# Patient Record
Sex: Male | Born: 1970 | Race: Black or African American | Hispanic: No | Marital: Single | Smoking: Never smoker
Health system: Southern US, Community
[De-identification: ages and names within clinical notes are randomized; demographics above are authoritative.]

## PROBLEM LIST (undated history)

## (undated) DIAGNOSIS — F32A Depression, unspecified: Secondary | ICD-10-CM

## (undated) DIAGNOSIS — F419 Anxiety disorder, unspecified: Secondary | ICD-10-CM

## (undated) DIAGNOSIS — F329 Major depressive disorder, single episode, unspecified: Secondary | ICD-10-CM

## (undated) DIAGNOSIS — I5022 Chronic systolic (congestive) heart failure: Secondary | ICD-10-CM

---
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
## 2016-12-29 ENCOUNTER — Emergency Department (HOSPITAL_COMMUNITY): Payer: Self-pay

## 2016-12-29 ENCOUNTER — Encounter (HOSPITAL_COMMUNITY): Payer: Self-pay

## 2016-12-29 ENCOUNTER — Emergency Department (HOSPITAL_COMMUNITY)
Admission: EM | Admit: 2016-12-29 | Discharge: 2016-12-29 | Disposition: A | Discharge status: Home / Self Care | Payer: Self-pay | Attending: Emergency Medicine | Admitting: Emergency Medicine

## 2016-12-29 DIAGNOSIS — B349 Viral infection, unspecified: Secondary | ICD-10-CM | POA: Insufficient documentation

## 2016-12-29 LAB — CBC
HCT: 37.8 % — ABNORMAL LOW (ref 39.0–52.0)
HEMOGLOBIN: 12.6 g/dL — AB (ref 13.0–17.0)
MCH: 29.4 pg (ref 26.0–34.0)
MCHC: 33.3 g/dL (ref 30.0–36.0)
MCV: 88.3 fL (ref 78.0–100.0)
Platelets: 197 10*3/uL (ref 150–400)
RBC: 4.28 MIL/uL (ref 4.22–5.81)
RDW: 14.3 % (ref 11.5–15.5)
WBC: 5.4 10*3/uL (ref 4.0–10.5)

## 2016-12-29 LAB — BASIC METABOLIC PANEL
ANION GAP: 9 (ref 5–15)
BUN: 10 mg/dL (ref 6–20)
CO2: 26 mmol/L (ref 22–32)
Calcium: 9.2 mg/dL (ref 8.9–10.3)
Chloride: 101 mmol/L (ref 101–111)
Creatinine, Ser: 1.07 mg/dL (ref 0.61–1.24)
Glucose, Bld: 128 mg/dL — ABNORMAL HIGH (ref 65–99)
Potassium: 3.8 mmol/L (ref 3.5–5.1)
Sodium: 136 mmol/L (ref 135–145)

## 2016-12-29 LAB — I-STAT TROPONIN, ED: TROPONIN I, POC: 0 ng/mL (ref 0.00–0.08)

## 2016-12-29 MED ORDER — IBUPROFEN 600 MG PO TABS: 600.0000 mg | ORAL_TABLET | Freq: Four times a day (QID) | ORAL | 0 refills | Status: DC | PRN

## 2016-12-29 MED ORDER — ONDANSETRON 4 MG PO TBDP: 4.0000 mg | ORAL_TABLET | Freq: Three times a day (TID) | ORAL | 0 refills | Status: DC | PRN

## 2016-12-29 MED ORDER — PREDNISONE 10 MG PO TABS: 20.0000 mg | ORAL_TABLET | Freq: Two times a day (BID) | ORAL | 0 refills | Status: AC

## 2016-12-29 MED ORDER — BENZONATATE 100 MG PO CAPS: 100.0000 mg | ORAL_CAPSULE | Freq: Three times a day (TID) | ORAL | 0 refills | Status: DC

## 2016-12-29 NOTE — ED Provider Notes (Signed)
[facility]Y DEPT Provider Note   CSN: [id number] Arrival date & time: 12/29/16  0522     History   Chief Complaint Chief Complaint  Patient presents with  . Influenza  . Chest Pain    HPI Leonard Sweeney is a 46 y.o. male.  HPI   Leonard Sweeney is a 46 y.o. male, patient with no pertinent past medical history, presenting to the ED with Chest discomfort beginning yesterday. Patient states that for the last 5 days he has had nasal congestion, nausea, vomiting, subjective fever, and intermittent productive cough with yellow or green sputum. Patient states that he believes his cough has worsened. Yesterday he began to have chest discomfort that he describes as a soreness, only present upon coughing, rated 7 out of 10, located in the lower chest, nonradiating. Patient is taken Alka-Seltzer for his symptoms with mild relief. Patient denies shortness of breath, current nausea, diarrhea, or any other complaints.      History reviewed. No pertinent past medical history.  There are no active problems to display for this patient.   History reviewed. No pertinent surgical history.     Home Medications    Prior to Admission medications   Medication Sig Start Date End Date Taking? Authorizing Provider  benzonatate (TESSALON) 100 MG capsule Take 1 capsule (100 mg total) by mouth every 8 (eight) hours. 12/29/16   Shawn C Joy, PA-C  ibuprofen (ADVIL,MOTRIN) 600 MG tablet Take 1 tablet (600 mg total) by mouth every 6 (six) hours as needed. 12/29/16   Shawn C Joy, PA-C  ondansetron (ZOFRAN ODT) 4 MG disintegrating tablet Take 1 tablet (4 mg total) by mouth every 8 (eight) hours as needed for nausea or vomiting. 12/29/16   Shawn C Joy, PA-C  predniSONE (DELTASONE) 10 MG tablet Take 2 tablets (20 mg total) by mouth 2 (two) times daily with a meal. 12/29/16 01/03/17  Shawn C Joy, PA-C    Family History No family history on file.  Social History Social History  Substance Use Topics  . Smoking  status: Never Smoker  . Smokeless tobacco: Not on file  . Alcohol use Not on file     Allergies   Patient has no allergy information on record.   Review of Systems Review of Systems  Constitutional: Positive for fever (subjective).  HENT: Positive for congestion.   Respiratory: Positive for cough. Negative for shortness of breath.   Gastrointestinal: Positive for nausea and vomiting. Negative for abdominal pain and diarrhea.  Musculoskeletal: Negative for neck pain and neck stiffness.       Chest soreness  Skin: Negative for rash.  Neurological: Negative for dizziness, weakness, light-headedness, numbness and headaches.  All other systems reviewed and are negative.    Physical Exam Updated Vital Signs BP (!) 152/76 (BP Location: Right Arm)   Pulse 91   Temp 98.4 F (36.9 C) (Oral)   Resp 18   SpO2 96%   Physical Exam  Constitutional: He appears well-developed and well-nourished. No distress.  HENT:  Head: Normocephalic and atraumatic.  Mouth/Throat: Oropharynx is clear and moist.  Eyes: Conjunctivae are normal.  Neck: Normal range of motion. Neck supple.  Cardiovascular: Normal rate, regular rhythm, normal heart sounds and intact distal pulses.   Pulmonary/Chest: Effort normal and breath sounds normal. No respiratory distress.  Abdominal: Soft. There is no tenderness. There is no guarding.  Musculoskeletal: He exhibits no edema.  Lymphadenopathy:    He has no cervical adenopathy.  Neurological: He is alert.  Skin:  Skin is warm and dry. He is not diaphoretic.  Psychiatric: He has a normal mood and affect. His behavior is normal.  Nursing note and vitals reviewed.    ED Treatments / Results  Labs (all labs ordered are listed, but only abnormal results are displayed) Labs Reviewed  BASIC METABOLIC PANEL - Abnormal; Notable for the following:       Result Value   Glucose, Bld 128 (*)    All other components within normal limits  CBC - Abnormal; Notable for the  following:    Hemoglobin 12.6 (*)    HCT 37.8 (*)    All other components within normal limits  I-STAT TROPOININ, ED    EKG  EKG Interpretation  Date/Time:  Monday December 29 2016 05:51:08 EDT Ventricular Rate:  88 PR Interval:  162 QRS Duration: 94 QT Interval:  360 QTC Calculation: 435 R Axis:   12 Text Interpretation:  Normal sinus rhythm Minimal voltage criteria for LVH, may be normal variant ST & T wave abnormality, consider inferolateral ischemia Abnormal ECG Confirmed by Wilkie Aye  MD, COURTNEY ([id number]) on 12/29/2016 7:06:52 AM       Radiology Dg Chest 2 View  Result Date: 12/29/2016 CLINICAL DATA:  Midchest pain and dyspnea for 5 days. EXAM: CHEST  2 VIEW COMPARISON:  None. FINDINGS: The lungs are clear. The pulmonary vasculature is normal. Heart size is normal. Hilar and mediastinal contours are unremarkable. There is no pleural effusion. IMPRESSION: No active cardiopulmonary disease. Electronically Signed   By: Ellery Plunk M.D.   On: 12/29/2016 06:10    Procedures Procedures (including critical care time)  Medications Ordered in ED Medications - No data to display   Initial Impression / Assessment and Plan / ED Course  I have reviewed the triage vital signs and the nursing notes.  Pertinent labs & imaging results that were available during my care of the patient were reviewed by me and considered in my medical decision making (see chart for details).     Patient presents with symptoms consistent with viral syndrome. Patient is nontoxic appearing and has no signs of sepsis. Lab results are encouraging.  Low suspicion for ACS. HEART score is 1, indicating low risk for a cardiac event. Wells criteria score is 0, indicating low risk for PE. Patient is also PERC negative. EKG abnormality noted and is nonspecific. Patient has no active chest pain and is low risk. Patient advised to follow up with a PCP for repeat EKG and any further management of his current complaint. The  patient was given instructions for home care as well as return precautions. Patient voices understanding of these instructions, accepts the plan, and is comfortable with discharge.  Findings and plan of care discussed with Ross Marcus, MD.   Vitals:   12/29/16 0530 12/29/16 6045 12/29/16 0624 12/29/16 0700  BP: (!) 152/76   128/82  Pulse: 91 98 87 84  Resp: 18 18 (!) 26 14  Temp: 98.4 F (36.9 C)     TempSrc: Oral     SpO2: 96% 96% 97% 97%     Final Clinical Impressions(s) / ED Diagnoses   Final diagnoses:  Viral syndrome    New Prescriptions Discharge Medication List as of 12/29/2016  7:11 AM    START taking these medications   Details  benzonatate (TESSALON) 100 MG capsule Take 1 capsule (100 mg total) by mouth every 8 (eight) hours., Starting Mon 12/29/2016, Print    ibuprofen (ADVIL,MOTRIN) 600 MG tablet Take 1  tablet (600 mg total) by mouth every 6 (six) hours as needed., Starting Mon 12/29/2016, Print    ondansetron (ZOFRAN ODT) 4 MG disintegrating tablet Take 1 tablet (4 mg total) by mouth every 8 (eight) hours as needed for nausea or vomiting., Starting Mon 12/29/2016, Print    predniSONE (DELTASONE) 10 MG tablet Take 2 tablets (20 mg total) by mouth 2 (two) times daily with a meal., Starting Mon 12/29/2016, Until Sat 01/03/2017, Print         Anselm Pancoast, PA-C 12/29/16 1610    Shon Baton, MD 12/30/16 [phone]

## 2016-12-29 NOTE — Discharge Instructions (Signed)
There were no acute abnormalities on the chest x-ray. Your EKG showed some changes that may be chronic, but you should follow up with a primary care provider soon to have a repeat EKG performed.  Your symptoms are consistent with a viral illness. Viruses do not require antibiotics. Treatment is symptomatic care and it is important to note that these symptoms may last for 7-14 days.   Hand washing: Wash your hands throughout the day, but especially before and after touching the face, using the restroom, sneezing, coughing, or touching surfaces that have been coughed or sneezed upon. Hydration: Symptoms will be intensified and complicated by dehydration. Dehydration can also extend the duration of symptoms. Drink plenty of fluids and get plenty of rest. You should be drinking at least half a liter of water an hour to stay hydrated. Electrolyte drinks are also encouraged. You should be drinking enough fluids to make your urine light yellow, almost clear. If this is not the case, you are not drinking enough water. Please note that some of the treatments indicated below will not be effective if you are not adequately hydrated. Pain or fever: Ibuprofen, Naproxen, or Tylenol for pain or fever.  Nausea/vomiting: Use the Zofran for nausea or vomiting.  Cough: Use the Tessalon for cough.  Congestion: Plain Mucinex may help relieve congestion. Saline sinus rinses and saline nasal sprays may also help relieve congestion.  Sore throat: Warm liquids or Chloraseptic spray may help soothe a sore throat. Gargle twice a day with a salt water solution made from a half teaspoon of salt in a cup of warm water.  Follow up: Follow up with a primary care provider, as needed, for any future management of this issue.

## 2016-12-29 NOTE — ED Triage Notes (Signed)
Pt states he started having flu like sx 5 days ago and has acute onset of chest pain yesterday; pt presents with non-productive cough at triage; pt a&ox 4 on arrival; pt states right side chest pain at 8/10 on arrival.

## 2019-04-12 ENCOUNTER — Other Ambulatory Visit: Payer: Self-pay | Admitting: Family Medicine

## 2019-04-12 ENCOUNTER — Ambulatory Visit: Payer: Self-pay

## 2019-04-12 ENCOUNTER — Other Ambulatory Visit: Payer: Self-pay

## 2019-04-12 DIAGNOSIS — M79641 Pain in right hand: Secondary | ICD-10-CM

## 2019-04-12 DIAGNOSIS — M25531 Pain in right wrist: Secondary | ICD-10-CM

## 2019-04-12 DIAGNOSIS — M79631 Pain in right forearm: Secondary | ICD-10-CM

## 2019-04-12 MED ORDER — HYDROCODONE-ACETAMINOPHEN 5-325 MG PO TABS: ORAL_TABLET | ORAL | 0 refills | Status: DC

## 2019-04-27 ENCOUNTER — Ambulatory Visit: Payer: Self-pay | Admitting: Orthopaedic Surgery

## 2019-04-28 ENCOUNTER — Ambulatory Visit: Payer: Self-pay | Admitting: Orthopaedic Surgery

## 2019-05-10 ENCOUNTER — Ambulatory Visit (INDEPENDENT_AMBULATORY_CARE_PROVIDER_SITE_OTHER): Payer: Worker's Compensation | Admitting: Orthopaedic Surgery

## 2019-05-10 ENCOUNTER — Ambulatory Visit: Payer: Self-pay

## 2019-05-10 ENCOUNTER — Encounter: Payer: Self-pay | Admitting: Orthopaedic Surgery

## 2019-05-10 DIAGNOSIS — M25531 Pain in right wrist: Secondary | ICD-10-CM | POA: Diagnosis not present

## 2019-05-10 DIAGNOSIS — S52531A Colles' fracture of right radius, initial encounter for closed fracture: Secondary | ICD-10-CM | POA: Diagnosis not present

## 2019-05-10 MED ORDER — HYDROCODONE-ACETAMINOPHEN 5-325 MG PO TABS: ORAL_TABLET | ORAL | 0 refills | Status: DC

## 2019-05-10 NOTE — Progress Notes (Signed)
Office Visit Note   Patient: Leonard Sweeney           Date of Birth: 10/27/1970           MRN: [id number] Visit Date: 05/10/2019              Requested by: No referring provider defined for this encounter. PCP: Patient, No Pcp Per   Assessment & Plan: Visit Diagnoses:  1. Pain in right wrist   2. Closed Colles' fracture of right radius, initial encounter     Plan: This is a fracture that we can treat nonoperatively.  He will continue the Velcro wrist splint but will come out of it to work on range of motion of his wrist.  I will send in some hydrocodone for his pain and have recommended over-the-counter Advil which will be 600 800 mg 2-3 times daily for inflammation and pain.  We will need to keep him out of his work for likely the next 6 to 8 weeks as he recovers from this injury.  I would like to see him back in 4 weeks with a repeat 2 views of the right wrist.  All question concerns were answered and addressed.  Follow-Up Instructions: Return in about 4 weeks (around 06/07/2019).   Orders:  Orders Placed This Encounter  Procedures  . XR Wrist 2 Views Right   Meds ordered this encounter  Medications  . HYDROcodone-acetaminophen (NORCO/VICODIN) 5-325 MG tablet    Sig: 1 pill every 6 hours as needed for breakthrough pain.    Dispense:  40 tablet    Refill:  0      Procedures: No procedures performed   Clinical Data: No additional findings.   Subjective: Chief Complaint  Patient presents with  . Right Hand - Pain  The patient is a right-hand-dominant 48 year old gentleman who sustained a work-related injury to his right wrist and forearm on July 13 of this year.  He was seen in the emergency room and found to have an extra-articular nondisplaced distal radius fracture and was placed in a Velcro wrist splint.  I believe Worker's Compensation has scheduled him with [place] today for follow-up.  He has not seen anyone from an orthopedic surgery standpoint for his right wrist in  the last 3 weeks since he sustained this injury.  He does report throbbing right wrist pain that does wake him up at night.  He does report some numbness and tingling in his right hand.  He has been wearing the Velcro wrist splint.  He is not a smoker and is not a diabetic.  He denies any other injuries as it relates to his right wrist injury.  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  HPI  Review of Systems He currently denies any headache, chest pain, shortness of breath, fever, chills, nausea, vomiting  Objective: Vital Signs: There were no vitals taken for this visit.  Physical Exam He is alert and orient x3 and in no acute distress Ortho Exam Examination of his right wrist does show some slight swelling.  He has limitations with palmar flexion and dorsiflexion related to pain.  He has full pronation supination but it is painful.  He hurts to palpation along the distal radius.  The wrist is well located.  His fingers are stiff but well-perfused.  He can make a composite fist but is slow to do so.  He has some slight subjective numbness in the median nerve  distribution but it is only slight.  He has palpable radial and ulnar pulses. Specialty Comments:  No specialty comments available.  Imaging: Xr Wrist 2 Views Right  Result Date: 05/10/2019 2 views of the right wrist show an extra-articular distal radius fracture and a ulnar styloid fracture.  The distal radius fracture is not shortened and is in neutral alignment on the lateral view.  There has been interval healing when compared to x-rays from 3 weeks ago.    PMFS History: There are no active problems to display for this patient.  History reviewed. No pertinent past medical history.  History reviewed. No pertinent family history.  History reviewed. No pertinent surgical history. Social History   Occupational History  . Not on file  Tobacco Use  . Smoking status: Never Smoker  Substance  and Sexual Activity  . Alcohol use: Not on file  . Drug use: Not on file  . Sexual activity: Not on file

## 2019-05-18 ENCOUNTER — Telehealth: Payer: Self-pay

## 2019-05-18 NOTE — Telephone Encounter (Signed)
Faxed the 05/10/19 office and work note to [facility] per her request  Pt is to return in 4 weeks but no appt was made. Asked her if I can make the appt with her or call pt?

## 2019-05-24 NOTE — Telephone Encounter (Signed)
Tried to call this patient for you. The telephone # is no good and contact person is no longer at the facility of where the number belongs to.

## 2019-06-02 ENCOUNTER — Telehealth: Payer: Self-pay | Admitting: Orthopaedic Surgery

## 2019-06-02 MED ORDER — HYDROCODONE-ACETAMINOPHEN 5-325 MG PO TABS: 1.0000 | ORAL_TABLET | Freq: Four times a day (QID) | ORAL | 0 refills | Status: DC | PRN

## 2019-06-02 NOTE — Telephone Encounter (Signed)
Please advise 

## 2019-06-02 NOTE — Telephone Encounter (Signed)
I will send in some.

## 2019-06-02 NOTE — Telephone Encounter (Signed)
Pt called in requesting a refill on medication hydrocodone, please have that sent to [facility] on [address]   [phone]

## 2019-06-02 NOTE — Telephone Encounter (Signed)
When u call pt give room number [phone]

## 2019-06-13 ENCOUNTER — Ambulatory Visit (INDEPENDENT_AMBULATORY_CARE_PROVIDER_SITE_OTHER): Payer: Worker's Compensation

## 2019-06-13 ENCOUNTER — Encounter: Payer: Self-pay | Admitting: Orthopaedic Surgery

## 2019-06-13 ENCOUNTER — Ambulatory Visit (INDEPENDENT_AMBULATORY_CARE_PROVIDER_SITE_OTHER): Payer: Worker's Compensation | Admitting: Orthopaedic Surgery

## 2019-06-13 DIAGNOSIS — S52531D Colles' fracture of right radius, subsequent encounter for closed fracture with routine healing: Secondary | ICD-10-CM

## 2019-06-13 DIAGNOSIS — S52531A Colles' fracture of right radius, initial encounter for closed fracture: Secondary | ICD-10-CM | POA: Insufficient documentation

## 2019-06-13 MED ORDER — HYDROCODONE-ACETAMINOPHEN 5-325 MG PO TABS: 1.0000 | ORAL_TABLET | Freq: Four times a day (QID) | ORAL | 0 refills | Status: DC | PRN

## 2019-06-13 NOTE — Progress Notes (Signed)
The patient is now 8 weeks status post a nondisplaced extra-articular right distal radius fracture.  He also had an ulnar styloid fracture.  He is been wearing a Velcro wrist splint.  He does report some wrist pain and stiffness but does feel like he is making progress.  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  He is right-hand dominant and this is his dominant side.  On exam there is no swelling or bruising about the right wrist.  It is ligamentously stable on exam.  There is stiffness with palmar flexion and dorsiflexion but the range of motion is almost full.  There is no pain to palpation over the ulnar styloid.  2 views of his right wrist are obtained today and it does show that the distal radius fracture itself looks like it is almost healed completely.  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  He will continue to work for just 4 more weeks given the fact that he performs heavy manual labor.  We will see him back for final visit in 4 weeks with a repeat 2 views of his right wrist.  All question concerns were answered and addressed.

## 2019-06-20 ENCOUNTER — Telehealth: Payer: Self-pay | Admitting: Orthopaedic Surgery

## 2019-06-20 NOTE — Telephone Encounter (Signed)
FYI

## 2019-06-20 NOTE — Telephone Encounter (Signed)
Emma from the [employer] group called in reference to a letter from the [employer] and would like for Dr. Ninfa Linden to hold off on his response until the [employer] group prepares their response.  Emma from the law office will call when it is ready.  CB#[phone].  Thank you.

## 2019-06-23 ENCOUNTER — Other Ambulatory Visit: Payer: Self-pay | Admitting: Orthopaedic Surgery

## 2019-06-23 ENCOUNTER — Telehealth: Payer: Self-pay | Admitting: Orthopaedic Surgery

## 2019-06-23 MED ORDER — HYDROCODONE-ACETAMINOPHEN 5-325 MG PO TABS: 1.0000 | ORAL_TABLET | Freq: Three times a day (TID) | ORAL | 0 refills | Status: DC | PRN

## 2019-06-23 NOTE — Telephone Encounter (Signed)
Please advise 

## 2019-06-23 NOTE — Telephone Encounter (Signed)
Patient called. Would like a refill on his Hydrocodone sent in. His call back number is [phone].

## 2019-07-04 ENCOUNTER — Telehealth: Payer: Self-pay | Admitting: Orthopaedic Surgery

## 2019-07-04 MED ORDER — HYDROCODONE-ACETAMINOPHEN 5-325 MG PO TABS: 1.0000 | ORAL_TABLET | Freq: Three times a day (TID) | ORAL | 0 refills | Status: DC | PRN

## 2019-07-04 NOTE — Telephone Encounter (Signed)
Patient called needing Rx refilled Hydrocodone. The humber to contact patient is [phone] Room [phone]

## 2019-07-04 NOTE — Telephone Encounter (Signed)
I have called patient multiple times, I can't get ahold of him

## 2019-07-04 NOTE — Telephone Encounter (Signed)
I will send in hydrocodone one more time for him.  Please let him know that this would be the last one we call and because he will soon be over 3 months out from his original injury and we need him to be off of these medications at this point.

## 2019-07-04 NOTE — Telephone Encounter (Signed)
Please advise 

## 2019-07-13 ENCOUNTER — Other Ambulatory Visit: Payer: Self-pay

## 2019-07-13 ENCOUNTER — Ambulatory Visit (INDEPENDENT_AMBULATORY_CARE_PROVIDER_SITE_OTHER): Payer: Worker's Compensation | Admitting: Orthopaedic Surgery

## 2019-07-13 ENCOUNTER — Encounter: Payer: Self-pay | Admitting: Orthopaedic Surgery

## 2019-07-13 ENCOUNTER — Ambulatory Visit (INDEPENDENT_AMBULATORY_CARE_PROVIDER_SITE_OTHER): Payer: Worker's Compensation

## 2019-07-13 DIAGNOSIS — S52531D Colles' fracture of right radius, subsequent encounter for closed fracture with routine healing: Secondary | ICD-10-CM | POA: Diagnosis not present

## 2019-07-13 MED ORDER — NABUMETONE 750 MG PO TABS: 750.0000 mg | ORAL_TABLET | Freq: Two times a day (BID) | ORAL | 1 refills | Status: DC | PRN

## 2019-07-13 NOTE — Progress Notes (Signed)
The patient is now 3 months into a nondisplaced right extra-articular distal radius fracture and ulnar styloid fracture that occurred on July 13 of this year.  This was during a work-related accident.  He has been out of work since then.  We have been just a Velcro wrist splint.  He still reports significant right wrist pain.  We have had him on hydrocodone for the last 3 months and I counseled him about this letting him know that we cannot keep him on that medication.  On exam his wrist function is almost completely normal.  He has a lack of full supination by just a few degrees and full dorsiflexion by only just a few degrees.  There is no swelling.  The wrist is stable ligamentously.  There is no pain over the ulnar styloid at all.  He reports it only hurts over the radial aspect of the wrist.  2 views of the distal radius are obtained today and compared to previous films.  The distal radius fracture is completely healed.  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  At this point of encouraged him again to not taking more narcotics and will send in some Relafen as an anti-inflammatory.  I want him to be out of the splint now.  I did give him a prescription for therapy on his wrist who with certified hand therapist to work on dexterity, coordination, range of motion and strengthening.  We will give him a work note to keep him out of work for 4 more weeks.  When we see him back in 4 weeks no x-rays are needed and hopefully we can work on getting him back to work.  All question concerns were answered and addressed.

## 2019-08-08 ENCOUNTER — Ambulatory Visit (INDEPENDENT_AMBULATORY_CARE_PROVIDER_SITE_OTHER): Payer: Worker's Compensation | Admitting: Orthopaedic Surgery

## 2019-08-08 ENCOUNTER — Other Ambulatory Visit: Payer: Self-pay

## 2019-08-08 ENCOUNTER — Encounter: Payer: Self-pay | Admitting: Orthopaedic Surgery

## 2019-08-08 DIAGNOSIS — M25531 Pain in right wrist: Secondary | ICD-10-CM

## 2019-08-08 DIAGNOSIS — S52531D Colles' fracture of right radius, subsequent encounter for closed fracture with routine healing: Secondary | ICD-10-CM

## 2019-08-08 MED ORDER — HYDROCODONE-ACETAMINOPHEN 5-325 MG PO TABS: 1.0000 | ORAL_TABLET | Freq: Three times a day (TID) | ORAL | 0 refills | Status: DC | PRN

## 2019-08-08 NOTE — Progress Notes (Signed)
The patient is now almost 4 months status post sustaining an extra-articular nondisplaced right distal radius fracture of his dominant wrist.  He has been out of work since then as he recovers from this injury.  His work involves heavy Retail buyer.  X-rays from his previous visit showed the fracture to heal completely so we did not need an x-ray today.  He says he is probably doing much better overall with his range of motion and strength.  He states that [employer] would not approve physical therapy so he did a home exercise program which he still participates in.  On examination of his right wrist, his rotation is full.  His flexion extension is full.  He has excellent grip strength.  His right hand is well-perfused.  There is no swelling.  It is ligamentously stable and seems to be pain-free.  His range of motion on the right wrist is exactly equal to the range of motion left wrist.  At this point follow-up can be as needed since he is doing so well.  There is no disability rating as it relates to his right wrist injury since his range of motion is full and his x-rays previously showed a healed fracture that was extra-articular.  All question concerns were answered and addressed.  I will send in pain medication 1 more time to use rarely and sparingly.  Follow-up is as needed at this point.

## 2019-09-01 ENCOUNTER — Telehealth: Payer: Self-pay | Admitting: Orthopaedic Surgery

## 2019-09-01 NOTE — Telephone Encounter (Signed)
At this point he is now 5 months out from his injury and when I saw him in early November the fracture had healed completely.  I told him at that point that would be the last time we provide hydrocodone for him.  He just needs to try Advil or Aleve as well as he can get a tube of Voltaren gel as a topical anti-inflammatory from the drugstore to rub on his wrist 2-3 times daily.

## 2019-09-01 NOTE — Telephone Encounter (Signed)
Tried to call patient, no answer.

## 2019-09-01 NOTE — Telephone Encounter (Signed)
Please advise 

## 2019-09-01 NOTE — Telephone Encounter (Signed)
Patient called requesting an RX refill on his Hydrocodone.  Patient uses [facility] on [address].  CB#[phone].  Thank you.

## 2019-09-30 ENCOUNTER — Encounter (HOSPITAL_COMMUNITY): Payer: Self-pay | Admitting: Family

## 2019-09-30 ENCOUNTER — Encounter (HOSPITAL_COMMUNITY): Payer: Self-pay | Admitting: Emergency Medicine

## 2019-09-30 ENCOUNTER — Other Ambulatory Visit: Payer: Self-pay

## 2019-09-30 ENCOUNTER — Emergency Department (HOSPITAL_COMMUNITY)
Admission: EM | Admit: 2019-09-30 | Discharge: 2019-09-30 | Disposition: A | Discharge status: Other Acute Inpatient Hospital | Payer: Self-pay | Attending: Emergency Medicine | Admitting: Emergency Medicine

## 2019-09-30 ENCOUNTER — Observation Stay (HOSPITAL_COMMUNITY)
Admission: AD | Admit: 2019-09-30 | Discharge: 2019-10-01 | Disposition: A | Discharge status: Home / Self Care | Payer: Self-pay | Source: Intra-hospital | Attending: Psychiatry | Admitting: Psychiatry

## 2019-09-30 DIAGNOSIS — R45851 Suicidal ideations: Secondary | ICD-10-CM | POA: Insufficient documentation

## 2019-09-30 DIAGNOSIS — G4700 Insomnia, unspecified: Secondary | ICD-10-CM | POA: Insufficient documentation

## 2019-09-30 DIAGNOSIS — F1494 Cocaine use, unspecified with cocaine-induced mood disorder: Principal | ICD-10-CM | POA: Diagnosis present

## 2019-09-30 DIAGNOSIS — Z87891 Personal history of nicotine dependence: Secondary | ICD-10-CM | POA: Insufficient documentation

## 2019-09-30 DIAGNOSIS — Z590 Homelessness: Secondary | ICD-10-CM | POA: Insufficient documentation

## 2019-09-30 DIAGNOSIS — F329 Major depressive disorder, single episode, unspecified: Secondary | ICD-10-CM | POA: Insufficient documentation

## 2019-09-30 DIAGNOSIS — Z79899 Other long term (current) drug therapy: Secondary | ICD-10-CM | POA: Insufficient documentation

## 2019-09-30 DIAGNOSIS — Z20822 Contact with and (suspected) exposure to covid-19: Secondary | ICD-10-CM | POA: Insufficient documentation

## 2019-09-30 DIAGNOSIS — F419 Anxiety disorder, unspecified: Secondary | ICD-10-CM | POA: Insufficient documentation

## 2019-09-30 DIAGNOSIS — Z791 Long term (current) use of non-steroidal anti-inflammatories (NSAID): Secondary | ICD-10-CM | POA: Insufficient documentation

## 2019-09-30 DIAGNOSIS — F1020 Alcohol dependence, uncomplicated: Secondary | ICD-10-CM | POA: Insufficient documentation

## 2019-09-30 HISTORY — DX: Anxiety disorder, unspecified: F41.9

## 2019-09-30 HISTORY — DX: Depression, unspecified: F32.A

## 2019-09-30 LAB — CBC WITH DIFFERENTIAL/PLATELET
Abs Immature Granulocytes: 0.02 10*3/uL (ref 0.00–0.07)
Basophils Absolute: 0 10*3/uL (ref 0.0–0.1)
Basophils Relative: 0 %
Eosinophils Absolute: 0 10*3/uL (ref 0.0–0.5)
Eosinophils Relative: 1 %
HCT: 41.8 % (ref 39.0–52.0)
Hemoglobin: 13.6 g/dL (ref 13.0–17.0)
Immature Granulocytes: 0 %
Lymphocytes Relative: 23 %
Lymphs Abs: 1.6 10*3/uL (ref 0.7–4.0)
MCH: 30.8 pg (ref 26.0–34.0)
MCHC: 32.5 g/dL (ref 30.0–36.0)
MCV: 94.8 fL (ref 80.0–100.0)
Monocytes Absolute: 0.6 10*3/uL (ref 0.1–1.0)
Monocytes Relative: 9 %
Neutro Abs: 4.7 10*3/uL (ref 1.7–7.7)
Neutrophils Relative %: 67 %
Platelets: 266 10*3/uL (ref 150–400)
RBC: 4.41 MIL/uL (ref 4.22–5.81)
RDW: 12.7 % (ref 11.5–15.5)
WBC: 7 10*3/uL (ref 4.0–10.5)
nRBC: 0 % (ref 0.0–0.2)

## 2019-09-30 LAB — RESPIRATORY PANEL BY RT PCR (FLU A&B, COVID)
Influenza A by PCR: NEGATIVE
Influenza B by PCR: NEGATIVE
SARS Coronavirus 2 by RT PCR: NEGATIVE

## 2019-09-30 LAB — COMPREHENSIVE METABOLIC PANEL
ALT: 28 U/L (ref 0–44)
AST: 35 U/L (ref 15–41)
Albumin: 3.9 g/dL (ref 3.5–5.0)
Alkaline Phosphatase: 69 U/L (ref 38–126)
Anion gap: 9 (ref 5–15)
BUN: 11 mg/dL (ref 6–20)
CO2: 29 mmol/L (ref 22–32)
Calcium: 9.8 mg/dL (ref 8.9–10.3)
Chloride: 102 mmol/L (ref 98–111)
Creatinine, Ser: 1.16 mg/dL (ref 0.61–1.24)
GFR calc Af Amer: >60 mL/min (ref >60)
GFR calc non Af Amer: >60 mL/min (ref >60)
Glucose, Bld: 103 mg/dL — ABNORMAL HIGH (ref 70–99)
Potassium: 3.7 mmol/L (ref 3.5–5.1)
Sodium: 140 mmol/L (ref 135–145)
Total Bilirubin: 0.8 mg/dL (ref 0.3–1.2)
Total Protein: 8 g/dL (ref 6.5–8.1)

## 2019-09-30 LAB — ACETAMINOPHEN LEVEL: Acetaminophen (Tylenol), Serum: <10 ug/mL — ABNORMAL LOW (ref 10–30)

## 2019-09-30 LAB — RAPID URINE DRUG SCREEN, HOSP PERFORMED
Amphetamines: NOT DETECTED
Barbiturates: NOT DETECTED
Benzodiazepines: NOT DETECTED
Cocaine: POSITIVE — AB
Opiates: NOT DETECTED
Tetrahydrocannabinol: NOT DETECTED

## 2019-09-30 LAB — ETHANOL: Alcohol, Ethyl (B): <10 mg/dL (ref <10)

## 2019-09-30 LAB — SALICYLATE LEVEL: Salicylate Lvl: <7 mg/dL — ABNORMAL LOW (ref 7.0–30.0)

## 2019-09-30 MED ORDER — ALUM & MAG HYDROXIDE-SIMETH 200-200-20 MG/5ML PO SUSP: 30.0000 mL | ORAL | Status: DC | PRN

## 2019-09-30 MED ORDER — MAGNESIUM HYDROXIDE 400 MG/5ML PO SUSP: 30.0000 mL | Freq: Every day | ORAL | Status: DC | PRN

## 2019-09-30 MED ORDER — TRAZODONE HCL 50 MG PO TABS: 50.0000 mg | ORAL_TABLET | Freq: Every evening | ORAL | Status: DC | PRN

## 2019-09-30 MED ORDER — HYDROXYZINE HCL 25 MG PO TABS: 25.0000 mg | ORAL_TABLET | Freq: Three times a day (TID) | ORAL | Status: DC | PRN

## 2019-09-30 MED ORDER — ACETAMINOPHEN 325 MG PO TABS: 650.0000 mg | ORAL_TABLET | Freq: Four times a day (QID) | ORAL | Status: DC | PRN

## 2019-09-30 NOTE — BH Assessment (Signed)
Tele Assessment Note   Patient Name: Leonard Sweeney MRN: [id number] Referring Physician: Arthor Captain Location of Patient: [facility] Location of Provider: Behavioral Health TTS Department  Jemmie Rhinehart is an 49 y.o. male who presented to [facility] seeking help for his cocaine problem, depression and his suicidal ideation.  Patient states that he has been using cocaine since the age of [age].  He states that his use has caused him to lose everything and he feels like he is just in people's way and states that they would be better off if he was dead.  Patient states that he has one prior suicide attempt at age [age] by cutting his wrist. Patient states that he is currently having thoughts of cutting himself again or to walk out in front of traffic.  Patient denies any current HI, but states that he hears voices that "give me bad advice."  Patient states that he also gets very paranoid at times.  Patient states that he is currently using 2-3 grams of cocaine daily and states that he has been drinking a quart of beer 3-4 times weekly.  Patient states that he is having sleep disturbance and states that he is only sleeping 2 hours per night and states that he has not been eating and states that he has lost approximately 60 pounds.  Patient denies any history of abuse.   Patient states that he was recently attending recovery meetings and states that he was living in a transitional house, but states that since he has relapsed that he has most likely cannot return there.  Patient states that he is single and has one grown children.  Patient presented as alert and oriented.  His mood is depressed and his affect flat.  His judgment, insight and impulse control are were impaired.  He did not appear to be responding to any internal stimuli.  His thoughts were organized and his memory intact.  His eye contact was good and his speech coherent.  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 No pertinent past medical history.  History reviewed. No pertinent surgical history.  Family History: No family history on file.  Social History:  reports that he has never smoked. He has never used smokeless tobacco. He reports current alcohol use. He reports current drug use. Drug: Cocaine.  Additional Social History:  Alcohol / Drug Use Pain Medications: see MAR Prescriptions: see MAR Over the Counter: see MAR History of alcohol / drug use?: Yes Longest period of sobriety (when/how long): UTA Negative Consequences of Use: Financial, Personal relationships, Work / School Substance #1 Name of Substance 1: cocaine 1 - Age of First Use: 21 1 - Amount (size/oz): 2-3 grams daily 1 - Frequency: daily 1 - Duration: since age 21 1 - Last Use / Amount: last pm Substance #2 Name of Substance 2: alcohol 2 - Age of First Use: 13 2 - Amount (size/oz): 1 quart beer 2 - Frequency: 2-3 times a week 2 - Duration: since onset 2 - Last Use / Amount: last pm  CIWA: CIWA-Ar BP: (!) 151/84 Pulse Rate: 85 COWS:    Allergies: Not on File  Home Medications: (Not in a hospital admission)   OB/GYN Status:  No LMP for male patient.  General Assessment Data Assessment unable to be completed: Yes Reason for not completing assessment: multiple walk[facility]ins at [facility] Location of Assessment: [facility] TTS Assessment: In system Is this a Tele or Face-to-Face Assessment?:  Tele Assessment Is this an Initial Assessment or a Re-assessment for this encounter?: Initial Assessment Patient Accompanied by:: N/A Language Other than English: No Living Arrangements: Homeless/Shelter What gender do you identify as?: Male Marital status: Single Living Arrangements: Alone Can pt return to current living arrangement?: Yes Admission Status: Voluntary Is patient capable of signing voluntary admission?: Yes Referral Source: Self/Family/Friend Insurance type:  self-pay     Crisis Care Plan Living Arrangements: Alone Legal Guardian: Other:(self) Name of Psychiatrist: none Name of Therapist: none  Education Status Is patient currently in school?: No Is the patient employed, unemployed or receiving disability?: Unemployed  Risk to self with the past 6 months Suicidal Ideation: Yes-Currently Present Has patient been a risk to self within the past 6 months prior to admission? : No Suicidal Intent: Yes-Currently Present Has patient had any suicidal intent within the past 6 months prior to admission? : No Is patient at risk for suicide?: Yes Suicidal Plan?: Yes-Currently Present Has patient had any suicidal plan within the past 6 months prior to admission? : No Specify Current Suicidal Plan: cut self or walk into traffic Access to Means: Yes Specify Access to Suicidal Means: streets and available sharpes What has been your use of drugs/alcohol within the last 12 months?: daily cocaine use and ETOH 3-4 times weekly Previous Attempts/Gestures: Yes How many times?: 1 Other Self Harm Risks: homeless and minimal support Triggers for Past Attempts: Unknown Intentional Self Injurious Behavior: Cutting Comment - Self Injurious Behavior: just once Family Suicide History: No Recent stressful life event(s): Job Loss, Financial Problems, Other (Comment)(homeless) Persecutory voices/beliefs?: No Depression: Yes Depression Symptoms: Despondent, Insomnia, Isolating, Loss of interest in usual pleasures, Feeling worthless/self pity Substance abuse history and/or treatment for substance abuse?: Yes Suicide prevention information given to non-admitted patients: Not applicable  Risk to Others within the past 6 months Homicidal Ideation: No Does patient have any lifetime risk of violence toward others beyond the six months prior to admission? : No Thoughts of Harm to Others: No Current Homicidal Intent: No Current Homicidal Plan: No Access to Homicidal  Means: No Identified Victim: none History of harm to others?: No Assessment of Violence: None Noted Violent Behavior Description: none Does patient have access to weapons?: No Criminal Charges Pending?: No Does patient have a court date: No Is patient on probation?: No  Psychosis Hallucinations: Auditory Delusions: None noted  Mental Status Report Appearance/Hygiene: Unremarkable Eye Contact: Good Motor Activity: Freedom of movement Speech: Unremarkable Level of Consciousness: Alert Mood: Depressed, Apathetic Affect: Depressed, Flat Anxiety Level: Moderate Thought Processes: Coherent, Relevant Judgement: Impaired Orientation: Person, Place, Time, Situation Obsessive Compulsive Thoughts/Behaviors: None  Cognitive Functioning Concentration: Normal Memory: Recent Intact, Remote Intact Is patient IDD: No Insight: Fair Impulse Control: Poor Appetite: Poor Have you had any weight changes? : Loss Amount of the weight change? (lbs): 60 lbs Sleep: Decreased Total Hours of Sleep: 2 Vegetative Symptoms: Decreased grooming  ADLScreening [facility] Assessment Services) Patient's cognitive ability adequate to safely complete daily activities?: Yes Patient able to express need for assistance with ADLs?: Yes Independently performs ADLs?: Yes (appropriate for developmental age)  Prior Inpatient Therapy Prior Inpatient Therapy: Yes Prior Therapy Dates: years ago Prior Therapy Facilty/Provider(s): [facility] Reason for Treatment: cocaine  Prior Outpatient Therapy Prior Outpatient Therapy: No Does patient have an ACCT team?: No Does patient have Intensive In-House Services?  : No Does patient have [facility] services? : No Does patient have P4CC services?: No  ADL Screening (condition at time of admission) Patient's  cognitive ability adequate to safely complete daily activities?: Yes Is the patient deaf or have difficulty hearing?: No Does the patient have difficulty seeing, even  when wearing glasses/contacts?: No Does the patient have difficulty concentrating, remembering, or making decisions?: No Patient able to express need for assistance with ADLs?: Yes Does the patient have difficulty dressing or bathing?: No Independently performs ADLs?: Yes (appropriate for developmental age) Does the patient have difficulty walking or climbing stairs?: No Weakness of Legs: None Weakness of Arms/Hands: None  Home Assistive Devices/Equipment Home Assistive Devices/Equipment: None  Therapy Consults (therapy consults require a physician order) PT Evaluation Needed: No OT Evalulation Needed: No SLP Evaluation Needed: No Abuse/Neglect Assessment (Assessment to be complete while patient is alone) Abuse/Neglect Assessment Can Be Completed: Yes Physical Abuse: Denies Verbal Abuse: Denies Sexual Abuse: Denies Exploitation of patient/patient's resources: Denies Self-Neglect: Denies Values / Beliefs Cultural Requests During Hospitalization: None Spiritual Requests During Hospitalization: None Consults Spiritual Care Consult Needed: No Transition of Care Team Consult Needed: No Advance Directives (For Healthcare) Does Patient Have a Medical Advance Directive?: No Would patient like information on creating a medical advance directive?: No - Patient declined Nutrition Screen- [facility] Has the patient recently lost weight without trying?: Yes, 34 lbs. or greater Has the patient been eating poorly because of a decreased appetite?: Yes Malnutrition Screening Tool Score: 5        Disposition: Per Berneice Heinrich, patient will need to be observed and monitored for safety overnight and may be moved to an OBS bed if one is available.  Patient will be re-assessed by a provider tomorrow morning.  Disposition Initial Assessment Completed for this Encounter: Yes  This service was provided via telemedicine using a 2-way, interactive audio and video technology.  Names of all  persons participating in this telemedicine service and their role in this encounter. Name: Elston Aldape Role: patient  Name: Avenly Roberge Role: TTS  Name:  Role:   Name:  Role:     Daphene Calamity 09/30/2019 6:40 PM

## 2019-09-30 NOTE — H&P (Signed)
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 male who [facility] help for his cocaine problem, depression and his suicidal ideation.  Patient states that he has been using cocaine since the age of [age].  He states that his use has caused him to lose everything and he feels like he is just in people's way and states that they would be better off if he was dead.  Patient states that he has one prior suicide attempt at age [age] by cutting his wrist. Patient states that he is currently having thoughts of cutting himself again or to walk out in front of traffic.  Patient denies any current HI, but states that he hears voices that "give me bad advice."  Patient states that he also gets very paranoid at times.  Patient states that he is currently using 2-3 grams of cocaine daily and states that he has been drinking a quart of beer 3-4 times weekly.  Patient states that he is having sleep disturbance and states that he is only sleeping 2 hours per night and states that he has not been eating and states that he has lost approximately 60 pounds.  Patient denies any history of abuse.   Patient states that he was recently attending recovery meetings and states that he was living in a transitional house, but states that since he has relapsed that he has most likely cannot return there.  Patient states that he is single and has one grown children.  Evaluation on Unit: Reviewed TTS assessment and validated with patient. On evaluation patient is alert and oriented x 4, pleasant, and cooperative. Speech is clear and coherent. Mood is depressed/anxious and  affect is congruent with mood. Thought process is coherent and thought content is logical. Denies current suicidal ideations. Denies homicidal ideations. Reports daily use of crack-cocaine. Occasionally drinks alcohol.  Denies use of other substances. Reports hearing internal voices of people from his past that give him bad advice and tell him to harm himself. No indication that patient is responding to internal stimuli.     Associated Signs/Symptoms: Depression Symptoms:  depressed mood, anhedonia, insomnia, hopelessness, suicidal thoughts without plan, anxiety, (Hypo) Manic Symptoms:  Impulsivity, Anxiety Symptoms:  Excessive Worry, Psychotic Symptoms:  Hallucinations: Auditory PTSD Symptoms: Negative Total Time spent with patient: 30 minutes  Past Psychiatric History: Cocaine use disorder  Is the patient at risk to self? Yes.    Has the patient been a risk to self in the past 6 months? No.  Has the patient been a risk to self within the distant past? Yes.    Is the patient a risk to others? No.  Has the patient been a risk to others in the past 6 months? No.  Has the patient been a risk to others within the distant past? No.   Prior Inpatient Therapy:   Prior Outpatient Therapy:    Alcohol Screening:   Substance Abuse History in the last 12 months:  Yes.   Consequences of Substance Abuse: Family Consequences:  family discord Previous Psychotropic Medications: Denies Psychological Evaluations: No  Past Medical History: No past medical history on file. No past surgical history on file. Family History: No family history  on file. Family Psychiatric History: Substance abuse Tobacco Screening:   Social History:  Social History   Substance and Sexual Activity  Alcohol Use Yes   Comment: quart of beer several times a week     Social History   Substance and Sexual Activity  Drug Use Yes  . Types: Cocaine    Additional Social History:                            Allergies:  No Known Allergies Lab Results:  Results for orders placed or performed during the hospital encounter of 09/30/19 (from the past 48 hour(s))  Urine rapid drug screen (hosp performed)     Status: Abnormal   Collection Time: 09/30/19  2:28 PM  Result Value Ref Range   Opiates NONE DETECTED NONE DETECTED   Cocaine POSITIVE (A) NONE DETECTED   Benzodiazepines NONE DETECTED NONE DETECTED   Amphetamines NONE DETECTED NONE DETECTED   Tetrahydrocannabinol NONE DETECTED NONE DETECTED   Barbiturates NONE DETECTED NONE DETECTED    Comment: (NOTE) DRUG SCREEN FOR MEDICAL PURPOSES ONLY.  IF CONFIRMATION IS NEEDED FOR ANY PURPOSE, NOTIFY LAB WITHIN 5 DAYS. LOWEST DETECTABLE LIMITS FOR URINE DRUG SCREEN Drug Class                     Cutoff (ng/mL) Amphetamine and metabolites    1000 Barbiturate and metabolites    200 Benzodiazepine                 200 Tricyclics and metabolites     300 Opiates and metabolites        300 Cocaine and metabolites        300 THC                            50 Performed at [facility], [address] [place], [place] [address]   Comprehensive metabolic panel     Status: Abnormal   Collection Time: 09/30/19  3:39 PM  Result Value Ref Range   Sodium 140 135 - 145 mmol/L   Potassium 3.7 3.5 - 5.1 mmol/L   Chloride 102 98 - 111 mmol/L   CO2 29 22 - 32 mmol/L   Glucose, Bld 103 (H) 70 - 99 mg/dL   BUN 11 6 - 20 mg/dL   Creatinine, Ser 7.41 0.61 - 1.24 mg/dL   Calcium 9.8 8.9 - 28.7 mg/dL   Total Protein 8.0 6.5 - 8.1 g/dL   Albumin 3.9 3.5 - 5.0 g/dL   AST 35 15 - 41 U/L   ALT 28 0 - 44 U/L   Alkaline Phosphatase 69 38 - 126 U/L   Total Bilirubin 0.8 0.3 - 1.2 mg/dL   GFR calc non Af Amer >60 >60 mL/min   GFR calc Af Amer >60 >60 mL/min   Anion gap 9 5 - 15    Comment: Performed at [facility], [address] [place], [place] [address]  Ethanol     Status: None   Collection Time: 09/30/19  3:39 PM   Result Value Ref Range   Alcohol, Ethyl (B) <10 <10 mg/dL    Comment: (NOTE) Lowest detectable limit for serum alcohol is 10 mg/dL. For medical purposes only. Performed at [facility], [address], [place], [place] [address]   CBC with Diff     Status: None  Collection Time: 09/30/19  3:39 PM  Result Value Ref Range   WBC 7.0 4.0 - 10.5 K/uL   RBC 4.41 4.22 - 5.81 MIL/uL   Hemoglobin 13.6 13.0 - 17.0 g/dL   HCT 37.9 02.4 - 09.7 %   MCV 94.8 80.0 - 100.0 fL   MCH 30.8 26.0 - 34.0 pg   MCHC 32.5 30.0 - 36.0 g/dL   RDW 35.3 29.9 - 24.2 %   Platelets 266 150 - 400 K/uL   nRBC 0.0 0.0 - 0.2 %   Neutrophils Relative % 67 %   Neutro Abs 4.7 1.7 - 7.7 K/uL   Lymphocytes Relative 23 %   Lymphs Abs 1.6 0.7 - 4.0 K/uL   Monocytes Relative 9 %   Monocytes Absolute 0.6 0.1 - 1.0 K/uL   Eosinophils Relative 1 %   Eosinophils Absolute 0.0 0.0 - 0.5 K/uL   Basophils Relative 0 %   Basophils Absolute 0.0 0.0 - 0.1 K/uL   Immature Granulocytes 0 %   Abs Immature Granulocytes 0.02 0.00 - 0.07 K/uL    Comment: Performed at [facility], [address], [place], [place] [address]  Salicylate level     Status: Abnormal   Collection Time: 09/30/19  3:39 PM  Result Value Ref Range   Salicylate Lvl <7.0 (L) 7.0 - 30.0 mg/dL    Comment: Performed at [facility], [address] [place], [place] [address]  Acetaminophen level     Status: Abnormal   Collection Time: 09/30/19  3:39 PM  Result Value Ref Range   Acetaminophen (Tylenol), Serum <10 (L) 10 - 30 ug/mL    Comment: (NOTE) Therapeutic concentrations vary significantly. A range of 10-30 ug/mL  may be an effective concentration for many patients. However, some  are best treated at concentrations outside of this range. Acetaminophen concentrations >150 ug/mL at 4 hours after ingestion  and >50 ug/mL at 12 hours after ingestion are often associated with  toxic  reactions. Performed at [facility], [address], [place], [place] [address]   Respiratory Panel by RT PCR (Flu A&B, Covid) - Nasopharyngeal Swab     Status: None   Collection Time: 09/30/19  4:06 PM   Specimen: Nasopharyngeal Swab  Result Value Ref Range   SARS Coronavirus 2 by RT PCR NEGATIVE NEGATIVE    Comment: (NOTE) SARS-CoV-2 target nucleic acids are NOT DETECTED. The SARS-CoV-2 RNA is generally detectable in upper respiratoy specimens during the acute phase of infection. The lowest concentration of SARS-CoV-2 viral copies this assay can detect is 131 copies/mL. A negative result does not preclude SARS-Cov-2 infection and should not be used as the sole basis for treatment or other patient management decisions. A negative result may occur with  improper specimen collection/handling, submission of specimen other than nasopharyngeal swab, presence of viral mutation(s) within the areas targeted by this assay, and inadequate number of viral copies (<131 copies/mL). A negative result must be combined with clinical observations, patient history, and epidemiological information. The expected result is Negative. Fact Sheet for Patients:  https://www.moore.com/ Fact Sheet for Healthcare Providers:  https://www.young.biz/ This test is not yet ap proved or cleared by the [place] FDA and  has been authorized for detection and/or diagnosis of SARS-CoV-2 by FDA under an Emergency Use Authorization (EUA). This EUA will remain  in effect (meaning this test can be used) for the duration of the COVID-19 declaration under Section 564(b)(1) of the Act, 21 U.S.C. section 360bbb-3(b)(1),  unless the authorization is terminated or revoked sooner.    Influenza A by PCR NEGATIVE NEGATIVE   Influenza B by PCR NEGATIVE NEGATIVE    Comment: (NOTE) The Xpert Xpress SARS-CoV-2/FLU/RSV assay is intended as an aid in  the diagnosis of  influenza from Nasopharyngeal swab specimens and  should not be used as a sole basis for treatment. Nasal washings and  aspirates are unacceptable for Xpert Xpress SARS-CoV-2/FLU/RSV  testing. Fact Sheet for Patients: PinkCheek.be Fact Sheet for Healthcare Providers: GravelBags.it This test is not yet approved or cleared by the [place] FDA and  has been authorized for detection and/or diagnosis of SARS-CoV-2 by  FDA under an Emergency Use Authorization (EUA). This EUA will remain  in effect (meaning this test can be used) for the duration of the  Covid-19 declaration under Section 564(b)(1) of the Act, 21  U.S.C. section 360bbb-3(b)(1), unless the authorization is  terminated or revoked. Performed at [facility], [place] [address], [place], [place] [address]     Blood Alcohol level:  Lab Results  Component Value Date   ETH <10 67/89/3810    Metabolic Disorder Labs:  No results found for: HGBA1C, MPG No results found for: PROLACTIN No results found for: CHOL, TRIG, HDL, CHOLHDL, VLDL, LDLCALC  Current Medications: Current Facility-Administered Medications  Medication Dose Route Frequency Provider Last Rate Last Admin  . acetaminophen (TYLENOL) tablet 650 mg  650 mg Oral Q6H PRN Rozetta Nunnery, NP      . alum & mag hydroxide-simeth (MAALOX/MYLANTA) 200-200-20 MG/5ML suspension 30 mL  30 mL Oral Q4H PRN Lindon Romp A, NP      . hydrOXYzine (ATARAX/VISTARIL) tablet 25 mg  25 mg Oral TID PRN Lindon Romp A, NP      . magnesium hydroxide (MILK OF MAGNESIA) suspension 30 mL  30 mL Oral Daily PRN Lindon Romp A, NP      . traZODone (DESYREL) tablet 50 mg  50 mg Oral QHS PRN Rozetta Nunnery, NP       PTA Medications: Medications Prior to Admission  Medication Sig Dispense Refill Last Dose  . benzonatate (TESSALON) 100 MG capsule Take 1 capsule (100 mg total) by mouth every 8 (eight) hours. (Patient not  taking: Reported on 09/30/2019) 21 capsule 0   . HYDROcodone-acetaminophen (NORCO/VICODIN) 5-325 MG tablet Take 1 tablet by mouth 3 (three) times daily as needed for moderate pain. (Patient not taking: Reported on 09/30/2019) 30 tablet 0   . ibuprofen (ADVIL,MOTRIN) 600 MG tablet Take 1 tablet (600 mg total) by mouth every 6 (six) hours as needed. (Patient not taking: Reported on 09/30/2019) 30 tablet 0   . nabumetone (RELAFEN) 750 MG tablet Take 1 tablet (750 mg total) by mouth 2 (two) times daily as needed. (Patient not taking: Reported on 09/30/2019) 60 tablet 1   . ondansetron (ZOFRAN ODT) 4 MG disintegrating tablet Take 1 tablet (4 mg total) by mouth every 8 (eight) hours as needed for nausea or vomiting. (Patient not taking: Reported on 09/30/2019) 20 tablet 0     Musculoskeletal: Strength & Muscle Tone: within normal limits Gait & Station: normal Patient leans: N/A  Psychiatric Specialty Exam: Physical Exam  Constitutional: He is oriented to person, place, and time. He appears well-developed and well-nourished. No distress.  HENT:  Head: Normocephalic and atraumatic.  Right Ear: External ear normal.  Left Ear: External ear normal.  Eyes: Pupils are equal, Sweeney, and reactive to light. Right eye exhibits no discharge. Left  eye exhibits no discharge.  Respiratory: Effort normal. No respiratory distress.  Musculoskeletal:        General: Normal range of motion.  Neurological: He is alert and oriented to person, place, and time.  Skin: He is not diaphoretic.  Psychiatric: His mood appears anxious. He exhibits a depressed mood.    Review of Systems  Constitutional: Positive for appetite change. Negative for activity change, chills, diaphoresis, fatigue, fever and unexpected weight change.  Respiratory: Negative.   Cardiovascular: Negative.   Gastrointestinal: Negative.   Psychiatric/Behavioral: Positive for dysphoric mood, sleep disturbance and suicidal ideas.    There were no vitals taken  for this visit.There is no height or weight on file to calculate BMI.  General Appearance: Casual and Fairly Groomed  Eye Contact:  Good  Speech:  Clear and Coherent and Normal Rate  Volume:  Normal  Mood:  Anxious, Depressed and Hopeless  Affect:  Congruent and Depressed  Thought Process:  Coherent, Goal Directed, Linear and Descriptions of Associations: Intact  Orientation:  Full (Time, Place, and Person)  Thought Content:  Logical and Hallucinations: Auditory  Suicidal Thoughts:  No  Homicidal Thoughts:  No  Memory:  Immediate;   Good  Judgement:  Intact  Insight:  Lacking  Psychomotor Activity:  Normal  Concentration:  Concentration: Fair  Recall:  [employer] of Knowledge:  Fair  Language:  Good  Akathisia:  Negative  Handed:  Right  AIMS (if indicated):     Assets:  Communication Skills Desire for Improvement Leisure Time Physical Health  ADL's:  Intact  Cognition:  WNL  Sleep:         Treatment Plan Summary: Daily contact with patient to assess and evaluate symptoms and progress in treatment and Medication management  Observation Level/Precautions:  15 minute checks Laboratory:  See ED labs Psychotherapy:  Individual Medications:   Hydroxyzine 25 mg TID prn for anxiety Trazodone 50 mg QHS prn for sleep Consultations:  Peer support Discharge Concerns:  Safety, continued substance abuse Estimated LOS: Other:      Jackelyn Poling, NP 1/1/20219:38 PM

## 2019-09-30 NOTE — ED Triage Notes (Signed)
Per pt, states he has been using crack for weeks-states he is tired of living-states his family is sick of his behavior

## 2019-09-30 NOTE — BH Assessment (Signed)
[facility] Assessment Progress Note    TTS attempting to get cart set up to see patient

## 2019-09-30 NOTE — ED Notes (Signed)
Safe transport notified for pt to go to [facility].

## 2019-09-30 NOTE — ED Provider Notes (Signed)
[facility]-EMERGENCY DEPT Provider Note   CSN: [id number] Arrival date & time: 09/30/19  1357     History Chief Complaint  Patient presents with  . Addiction Problem  . Suicidal    Leonard Sweeney is a 49 y.o. male.  With no significant past medical history who presents the emergency department with chief complaint of suicidal ideation.  Patient states that he has been using crack for several months.  His family has been very upset with him trying to get him to go to rehab.  Patient states that he feels depressed.  He has both suicidal and homicidal ideation at times.  He states that he frequently feels like he does not want to be alive anymore.  He denies any active plans for either suicide or homicide.  He denies audiovisual hallucinations.  HPI     History reviewed. No pertinent past medical history.  Patient Active Problem List   Diagnosis Date Noted  . Fracture, [place], right, closed 06/13/2019    History reviewed. No pertinent surgical history.     No family history on file.  Social History   Tobacco Use  . Smoking status: Never Smoker  Substance Use Topics  . Alcohol use: Not on file  . Drug use: Yes    Types: Cocaine    Home Medications Prior to Admission medications   Medication Sig Start Date End Date Taking? Authorizing Provider  benzonatate (TESSALON) 100 MG capsule Take 1 capsule (100 mg total) by mouth every 8 (eight) hours. 12/29/16   Joy, Shawn C, PA-C  HYDROcodone-acetaminophen (NORCO/VICODIN) 5-325 MG tablet Take 1 tablet by mouth 3 (three) times daily as needed for moderate pain. 08/08/19   Kathryne Hitch, MD  ibuprofen (ADVIL,MOTRIN) 600 MG tablet Take 1 tablet (600 mg total) by mouth every 6 (six) hours as needed. 12/29/16   Joy, Shawn C, PA-C  nabumetone (RELAFEN) 750 MG tablet Take 1 tablet (750 mg total) by mouth 2 (two) times daily as needed. 07/13/19   Kathryne Hitch, MD  ondansetron (ZOFRAN ODT) 4 MG  disintegrating tablet Take 1 tablet (4 mg total) by mouth every 8 (eight) hours as needed for nausea or vomiting. 12/29/16   Joy, Hillard Danker, PA-C    Allergies    Patient has no allergy information on record.  Review of Systems   Review of Systems Ten systems reviewed and are negative for acute change, except as noted in the HPI.   Physical Exam Updated Vital Signs BP (!) 151/84 (BP Location: Left Arm)   Pulse 85   Temp 98 F (36.7 C) (Oral)   Resp 18   SpO2 99%   Physical Exam Vitals and nursing note reviewed.  Constitutional:      General: He is not in acute distress.    Appearance: He is well-developed. He is not diaphoretic.  HENT:     Head: Normocephalic and atraumatic.  Eyes:     General: No scleral icterus.    Conjunctiva/sclera: Conjunctivae normal.  Cardiovascular:     Rate and Rhythm: Normal rate and regular rhythm.     Heart sounds: Normal heart sounds.  Pulmonary:     Effort: Pulmonary effort is normal. No respiratory distress.     Breath sounds: Normal breath sounds.  Abdominal:     Palpations: Abdomen is soft.     Tenderness: There is no abdominal tenderness.  Musculoskeletal:     Cervical back: Normal range of motion and neck supple.  Skin:  General: Skin is warm and dry.  Neurological:     Mental Status: He is alert.  Psychiatric:        Mood and Affect: Mood is depressed. Affect is flat.        Behavior: Behavior normal.     ED Results / Procedures / Treatments   Labs (all labs ordered are listed, but only abnormal results are displayed) Labs Reviewed  RESPIRATORY PANEL BY RT PCR (FLU A&B, COVID)  COMPREHENSIVE METABOLIC PANEL  ETHANOL  RAPID URINE DRUG SCREEN, HOSP PERFORMED  CBC WITH DIFFERENTIAL/PLATELET  SALICYLATE LEVEL  ACETAMINOPHEN LEVEL    EKG None  Radiology No results found.  Procedures Procedures (including critical care time)  Medications Ordered in ED Medications - No data to display  ED Course  I have reviewed  the triage vital signs and the nursing notes.  Pertinent labs & imaging results that were available during my care of the patient were reviewed by me and considered in my medical decision making (see chart for details).    MDM Rules/Calculators/A&P                      Patient here with cocaine abuse.  He also has suicidal ideation and depression.  His lab results are currently pending.  I have ordered TTS consult.  I have given sign out to PA Henderly. Final Clinical Impression(s) / ED Diagnoses Final diagnoses:  None    Rx / DC Orders ED Discharge Orders    None       Margarita Mail, PA-C 09/30/19 1540    Nat Christen, MD 09/30/19 1555
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
## 2019-09-30 NOTE — ED Provider Notes (Signed)
Care transferred from [place], PA-C at shift change.  See note for full HPI.    In summation patient with polysubstance abuse, not under IVC.  Patient with passive SI thoughts.  Plan for medical clearance and TTS consult.  Physical Exam  BP (!) 144/81 (BP Location: Right Arm)   Pulse 77   Temp 98 F (36.7 C) (Oral)   Resp 18   SpO2 99%   Physical Exam Vitals and nursing note reviewed.  Constitutional:      General: He is not in acute distress.    Appearance: He is well-developed. He is not diaphoretic.  HENT:     Head: Atraumatic.  Eyes:     Pupils: Pupils are equal, round, and reactive to light.  Cardiovascular:     Rate and Rhythm: Normal rate and regular rhythm.  Pulmonary:     Effort: Pulmonary effort is normal. No respiratory distress.  Abdominal:     General: There is no distension.     Palpations: Abdomen is soft.  Musculoskeletal:        General: Normal range of motion.     Cervical back: Normal range of motion and neck supple.  Skin:    General: Skin is warm and dry.  Neurological:     Mental Status: He is alert.    ED Course/Procedures     Procedures Labs Reviewed  COMPREHENSIVE METABOLIC PANEL - Abnormal; Notable for the following components:      Result Value   Glucose, Bld 103 (*)    All other components within normal limits  RAPID URINE DRUG SCREEN, HOSP PERFORMED - Abnormal; Notable for the following components:   Cocaine POSITIVE (*)    All other components within normal limits  SALICYLATE LEVEL - Abnormal; Notable for the following components:   Salicylate Lvl <7.0 (*)    All other components within normal limits  ACETAMINOPHEN LEVEL - Abnormal; Notable for the following components:   Acetaminophen (Tylenol), Serum <10 (*)    All other components within normal limits  RESPIRATORY PANEL BY RT PCR (FLU A&B, COVID)  ETHANOL  CBC WITH DIFFERENTIAL/PLATELET   MDM  Patient has been medically cleared.   Psychiatry with plans to observe patient  overnight.  He will be transferred to [facility] for observation.       Chanay Nugent A, PA-C 09/30/19 2111    Rolan Bucco, MD 09/30/19 [phone]

## 2019-10-01 ENCOUNTER — Ambulatory Visit (HOSPITAL_COMMUNITY): Payer: Self-pay

## 2019-10-01 DIAGNOSIS — F1494 Cocaine use, unspecified with cocaine-induced mood disorder: Secondary | ICD-10-CM

## 2019-10-01 MED ORDER — GABAPENTIN 300 MG PO CAPS: 300.0000 mg | ORAL_CAPSULE | Freq: Two times a day (BID) | ORAL | 0 refills | Status: DC

## 2019-10-01 MED ORDER — GABAPENTIN 300 MG PO CAPS: 300.0000 mg | ORAL_CAPSULE | Freq: Two times a day (BID) | ORAL | Status: DC

## 2019-10-01 NOTE — Progress Notes (Signed)
[place] NOVEL CORONAVIRUS (COVID-19) DAILY CHECK-OFF SYMPTOMS - answer yes or no to each - every day NO YES  Have you had a fever in the past 24 hours?  . Fever (Temp > 37.80C / 100F) X   Have you had any of these symptoms in the past 24 hours? . New Cough .  Sore Throat  .  Shortness of Breath .  Difficulty Breathing .  Unexplained Body Aches   X   Have you had any one of these symptoms in the past 24 hours not related to allergies?   . Runny Nose .  Nasal Congestion .  Sneezing   X   If you have had runny nose, nasal congestion, sneezing in the past 24 hours, has it worsened?  X   EXPOSURES - check yes or no X   Have you traveled outside the state in the past 14 days?  X   Have you been in contact with someone with a confirmed diagnosis of COVID-19 or PUI in the past 14 days without wearing appropriate PPE?  X   Have you been living in the same home as a person with confirmed diagnosis of COVID-19 or a PUI (household contact)?    X   Have you been diagnosed with COVID-19?    X              What to do next: Answered NO to all: Answered YES to anything:   Proceed with unit schedule Follow the [facility] Inpatient Flowsheet.   

## 2019-10-01 NOTE — Progress Notes (Signed)
   Patient is a black male of 48 years oriented to place, time, location and person.     Patient presents flat, very little emotion.stating that he slept "good" last night  denies using medication to sleep.  Appetite reported as "good" and energy level reported " very low". Patient endorsed depression, anxiety and SI without plan.  Patient denies HI, and AVH at this time. He denies pain today.    Scheduled medications administered to patient, per MD orders, no adverse reaction observed.  Vital signs monitored.  Support and encouragement provided.  Routine safety checks conducted every 15 minutes.  Patient agreed to notify staff with problems or concerns.    Patient remains safe at this timePt is self isolating, no interacting on unit.   Einar Crow. Melvyn Neth MSN, RN, [facility] [phone]

## 2019-10-01 NOTE — Patient Outreach (Signed)
CPSS met with the patient at the [facility] Observation Unit in order to provide substance use recovery support and provide information for substance use recovery resources. Patient reports a history of daily crack cocaine use. Patient is interested in getting connected to a residential substance use treatment center. CPSS talked to the patient about residential substance use treatment beds are limited at this time as well as provided the patient with a residential substance use treatment center list. CPSS talked to the patient about [place] houses as another option for substance use recovery support if patient is unable to get connected to a residential substance use treatment center. CPSS provided the patient with an Corporate investment banker along with a Hydrograph[employer] surveyor house services. Additionally, CPSS provided information for other substance use recovery resources including outpatient substance use treatment center list, flier for [facility] of the [place] outpatient dual diagnosis treatment services, online/in-person [facility], and CPSS contact information. CPSS strongly encouraged the patient to contact CPSS if needed for further help with CPSS substance use recovery outreach services.

## 2019-10-01 NOTE — [facility] Suicide Risk Assessment (Cosign Needed)
Suicide Risk Assessment  Discharge Assessment   [facility] Discharge Suicide Risk Assessment   Principal Problem: Cocaine-induced mood disorder (HCC) Discharge Diagnoses: Principal Problem:   Cocaine-induced mood disorder (HCC)   Total Time spent with patient: 30 minutes  Musculoskeletal: Strength & Muscle Tone: within normal limits Gait & Station: normal Patient leans: N/A  Psychiatric Specialty Exam:   Blood pressure (!) 147/73, pulse 68, temperature 98.4 F (36.9 C), temperature source Oral, resp. rate 18, height 5\' 11"  (1.803 m), weight 97.5 kg, SpO2 100 %.Body mass index is 29.99 kg/m.  General Appearance: Casual and Fairly Groomed  Eye Contact::  Good  Speech:  Clear and Coherent and Normal Rate409  Volume:  Normal  Mood:  Depressed  Affect:  Appropriate and Congruent  Thought Process:  Coherent, Goal Directed and Descriptions of Associations: Intact  Orientation:  Full (Time, Place, and Person)  Thought Content:  WDL and Logical  Suicidal Thoughts:  No  Homicidal Thoughts:  No  Memory:  Immediate;   Good Recent;   Good Remote;   Good  Judgement:  Fair  Insight:  Fair  Psychomotor Activity:  Normal  Concentration:  Good  Recall:  Good  Fund of Knowledge:Good  Language: Good  Akathisia:  No  Handed:  Right  AIMS (if indicated):     Assets:  Communication Skills Desire for Improvement Financial Resources/Insurance Leisure Time Physical Health Resilience Social Support  Sleep:     Cognition: WNL  ADL's:  Intact   Mental Status Per Nursing Assessment::   On Admission:  Suicidal ideation indicated by patient  Demographic Factors:  Male  Loss Factors: NA  Historical Factors: NA  Risk Reduction Factors:   Positive social support, Positive therapeutic relationship and Positive coping skills or problem solving skills  Continued Clinical Symptoms:  Alcohol/Substance Abuse/Dependencies  Cognitive Features That Contribute To Risk:  None    Suicide  Risk:  Minimal: No identifiable suicidal ideation.  Patients presenting with no risk factors but with morbid ruminations; may be classified as minimal risk based on the severity of the depressive symptoms    Plan Of Care/Follow-up recommendations:  Other:  Follow-up with outpatient psychiatry, suggest [facility].  [phone], FNP 10/01/2019, 1:41 PM

## 2019-10-01 NOTE — Discharge Summary (Addendum)
Physician Discharge Summary Note  Patient:  Leonard Sweeney is an 49 y.o., male MRN:  [id number] DOB:  December 15, 1970 Patient phone:  [phone] (home)  Patient address:   [address] [place] [address],  Total Time spent with patient: 30 minutes  Date of Admission:  09/30/2019 Date of Discharge: 10/01/2019  Reason for Admission: Patient admitted voluntarily, seeking help for cocaine use and depression.  Principal Problem: Cocaine-induced mood disorder [facility]) Discharge Diagnoses: Principal Problem:   Cocaine-induced mood disorder ([place])   Past Psychiatric History: Cocaine use disorder  Past Medical History:  Past Medical History:  Diagnosis Date   Anxiety    Depression    History reviewed. No pertinent surgical history. Family History: History reviewed. No pertinent family history. Family Psychiatric  History: Denies Social History:  Social History   Substance and Sexual Activity  Alcohol Use Yes   Comment: quart of beer several times a week     Social History   Substance and Sexual Activity  Drug Use Yes   Types: Cocaine    Social History   Socioeconomic History   Marital status: Single    Spouse name: Not on file   Number of children: Not on file   Years of education: Not on file   Highest education level: 12th grade  Occupational History   Not on file  Tobacco Use   Smoking status: Former Smoker    Types: Cigarettes   Smokeless tobacco: Never Used  Substance and Sexual Activity   Alcohol use: Yes    Comment: quart of beer several times a week   Drug use: Yes    Types: Cocaine   Sexual activity: Not Currently  Other Topics Concern   Not on file  Social History Narrative   Not on file   Social Determinants of Health   Financial Resource Strain:    Difficulty of Paying Living Expenses: Not on file  Food Insecurity:    Worried About Charity fundraiser in the Last Year: Not on file   [employer] of Food in the Last Year: Not on file  Transportation  Needs:    Lack of Transportation (Medical): Not on file   Lack of Transportation (Non-Medical): Not on file  Physical Activity:    Days of Exercise per Week: Not on file   Minutes of Exercise per Session: Not on file  Stress:    Feeling of Stress : Not on file  Social Connections:    Frequency of Communication with Friends and Family: Not on file   Frequency of Social Gatherings with Friends and Family: Not on file   Attends Religious Services: Not on file   Active Member of Clubs or Organizations: Not on file   Attends Archivist Meetings: Not on file   Marital Status: Not on file    Hospital Course: Patient admitted to observation unit voluntary status.  Patient reports cocaine use, patient reports increased stressor of homelessness at this time.  Patient reports recently left [place] related to disagreement with staff.  Patient reports recently that transitional home on [employer] related to disagreement with transitional home staff.  Patient reports average appetite and 8-hour sleep while inpatient.  Patient currently denies suicidal and homicidal ideations, denies hallucinations.  Peers support consult ordered to address substance use disorder needs.  Patient plans to discharge to [facility] or [facility] downtown.  Patient plans to follow-up with [facility] [place]. Patient seen along with Dr. Darleene Cleaver.  Discharge recommended.  Physical Findings: AIMS: Facial and Oral Movements Muscles of Facial Expression: None, normal Lips and Perioral Area: None, normal Jaw: None, normal Tongue: None, normal,Extremity Movements Upper (arms, wrists, hands, fingers): None, normal Lower (legs, knees, ankles, toes): None, normal, Trunk Movements Neck, shoulders, hips: None, normal, Overall Severity Severity of abnormal movements (highest score from questions above): None, normal Incapacitation due to abnormal movements: None, normal Patient's awareness of abnormal  movements (rate only patient's report): No Awareness, Dental Status Current problems with teeth and/or dentures?: No Does patient usually wear dentures?: No  CIWA:  CIWA-Ar Total: 2 COWS:  COWS Total Score: 2  Musculoskeletal: Strength & Muscle Tone: within normal limits Gait & Station: normal Patient leans: N/A  Psychiatric Specialty Exam: Physical Exam  Nursing note and vitals reviewed. Constitutional: He is oriented to person, place, and time. He appears well-developed.  HENT:  Head: Normocephalic.  Cardiovascular: Normal rate.  Respiratory: Effort normal.  Neurological: He is alert and oriented to person, place, and time.  Psychiatric: He has a normal mood and affect. His behavior is normal. Judgment and thought content normal.    Review of Systems  Constitutional: Negative.   HENT: Negative.   Eyes: Negative.   Respiratory: Negative.   Cardiovascular: Negative.   Gastrointestinal: Negative.   Genitourinary: Negative.   Musculoskeletal: Negative.   Skin: Negative.   Neurological: Negative.     Blood pressure (!) 147/73, pulse 68, temperature 98.4 F (36.9 C), temperature source Oral, resp. rate 18, height 5\' 11"  (1.803 m), weight 97.5 kg, SpO2 100 %.Body mass index is 29.99 kg/m.  General Appearance: Casual and Fairly Groomed  Eye Contact:  Good  Speech:  Clear and Coherent and Normal Rate  Volume:  Normal  Mood:  Depressed  Affect:  Appropriate and Congruent  Thought Process:  Coherent, Goal Directed and Descriptions of Associations: Intact  Orientation:  Full (Time, Place, and Person)  Thought Content:  WDL and Logical  Suicidal Thoughts:  No  Homicidal Thoughts:  No  Memory:  Immediate;   Good Recent;   Good Remote;   Good  Judgement:  Fair  Insight:  Fair  Psychomotor Activity:  Normal  Concentration:  Concentration: Good and Attention Span: Good  Recall:  Good  Fund of Knowledge:  Good  Language:  Good  Akathisia:  No  Handed:  Right  AIMS (if  indicated):     Assets:  Communication Skills Desire for Improvement Financial Resources/Insurance Physical Health Resilience Social Support Talents/Skills  ADL's:  Intact  Cognition:  WNL  Sleep:           Has this patient used any form of tobacco in the last 30 days? (Cigarettes, Smokeless Tobacco, Cigars, and/or Pipes) Yes, Yes, A prescription for an FDA-approved tobacco cessation medication was offered at discharge and the patient refused  Blood Alcohol level:  Lab Results  Component Value Date   ETH <10 09/30/2019    Metabolic Disorder Labs:  No results found for: HGBA1C, MPG No results found for: PROLACTIN No results found for: CHOL, TRIG, HDL, CHOLHDL, VLDL, LDLCALC  See Psychiatric Specialty Exam and Suicide Risk Assessment completed by Attending Physician prior to discharge.  Discharge destination:  Home  Is patient on multiple antipsychotic therapies at discharge:  No   Has Patient had three or more failed trials of antipsychotic monotherapy by history:  No  Recommended Plan for Multiple Antipsychotic Therapies: NA    Allergies as of 10/01/2019   No Known Allergies  Medication List     STOP taking these medications    benzonatate 100 MG capsule Commonly known as: TESSALON   HYDROcodone-acetaminophen 5-325 MG tablet Commonly known as: NORCO/VICODIN   ibuprofen 600 MG tablet Commonly known as: ADVIL   ondansetron 4 MG disintegrating tablet Commonly known as: Zofran ODT       TAKE these medications      Indication  gabapentin 300 MG capsule Commonly known as: NEURONTIN Take 1 capsule (300 mg total) by mouth 2 (two) times daily.  Indication: Cocaine/agitated mood   nabumetone 750 MG tablet Commonly known as: RELAFEN Take 1 tablet (750 mg total) by mouth 2 (two) times daily as needed.  Indication: Joint Damage causing Pain and Loss of Function          Follow-up recommendations:  Other:  Follow-up with outpatient psychiatry,  suggest [facility].   Follow-up with substance use treatment resources.  Comments: Discharge Signed: Patrcia Dolly, FNP 10/01/2019, 1:44 PM Patient seen face-to-face for psychiatric evaluation, chart reviewed and case discussed with the physician extender and developed treatment plan. Reviewed the information documented and agree with the treatment plan. Thedore Mins, MD

## 2019-10-01 NOTE — Progress Notes (Signed)
Discharge:  Pt discharged at 4:30pm to private resident with family member. Pt given new medication orders and indicated understanding of new orders. Patient's family member called  to arrange pickup at [facility]. Pt stated he would arrange follow up appointment with providers and to pick up Rx. All belongings returned and signed for. No signs of distress observed.     Einar Crow. Melvyn Neth MSN, RN, [facility] [phone]

## 2019-11-27 ENCOUNTER — Other Ambulatory Visit: Payer: Self-pay

## 2019-11-27 ENCOUNTER — Emergency Department (HOSPITAL_COMMUNITY): Payer: Self-pay

## 2019-11-27 ENCOUNTER — Emergency Department (HOSPITAL_COMMUNITY)
Admission: EM | Admit: 2019-11-27 | Discharge: 2019-11-27 | Disposition: A | Discharge status: Home / Self Care | Payer: Self-pay | Attending: Emergency Medicine | Admitting: Emergency Medicine

## 2019-11-27 DIAGNOSIS — R0602 Shortness of breath: Secondary | ICD-10-CM | POA: Insufficient documentation

## 2019-11-27 DIAGNOSIS — U071 COVID-19: Secondary | ICD-10-CM | POA: Insufficient documentation

## 2019-11-27 DIAGNOSIS — Z87891 Personal history of nicotine dependence: Secondary | ICD-10-CM | POA: Insufficient documentation

## 2019-11-27 DIAGNOSIS — R7989 Other specified abnormal findings of blood chemistry: Secondary | ICD-10-CM | POA: Insufficient documentation

## 2019-11-27 DIAGNOSIS — Z79899 Other long term (current) drug therapy: Secondary | ICD-10-CM | POA: Insufficient documentation

## 2019-11-27 LAB — URINALYSIS, ROUTINE W REFLEX MICROSCOPIC
Bacteria, UA: NONE SEEN
Bilirubin Urine: NEGATIVE
Glucose, UA: NEGATIVE mg/dL
Ketones, ur: NEGATIVE mg/dL
Leukocytes,Ua: NEGATIVE
Nitrite: NEGATIVE
Protein, ur: NEGATIVE mg/dL
Specific Gravity, Urine: 1.014 (ref 1.005–1.030)
pH: 5 (ref 5.0–8.0)

## 2019-11-27 LAB — COMPREHENSIVE METABOLIC PANEL
ALT: 32 U/L (ref 0–44)
AST: 32 U/L (ref 15–41)
Albumin: 3.6 g/dL (ref 3.5–5.0)
Alkaline Phosphatase: 64 U/L (ref 38–126)
Anion gap: 10 (ref 5–15)
BUN: 15 mg/dL (ref 6–20)
CO2: 21 mmol/L — ABNORMAL LOW (ref 22–32)
Calcium: 8.9 mg/dL (ref 8.9–10.3)
Chloride: 103 mmol/L (ref 98–111)
Creatinine, Ser: 0.94 mg/dL (ref 0.61–1.24)
GFR calc Af Amer: >60 mL/min (ref >60)
GFR calc non Af Amer: >60 mL/min (ref >60)
Glucose, Bld: 120 mg/dL — ABNORMAL HIGH (ref 70–99)
Potassium: 3.9 mmol/L (ref 3.5–5.1)
Sodium: 134 mmol/L — ABNORMAL LOW (ref 135–145)
Total Bilirubin: 0.9 mg/dL (ref 0.3–1.2)
Total Protein: 8 g/dL (ref 6.5–8.1)

## 2019-11-27 LAB — CBC WITH DIFFERENTIAL/PLATELET
Abs Immature Granulocytes: 0.02 10*3/uL (ref 0.00–0.07)
Basophils Absolute: 0 10*3/uL (ref 0.0–0.1)
Basophils Relative: 0 %
Eosinophils Absolute: 0 10*3/uL (ref 0.0–0.5)
Eosinophils Relative: 0 %
HCT: 37.1 % — ABNORMAL LOW (ref 39.0–52.0)
Hemoglobin: 12.7 g/dL — ABNORMAL LOW (ref 13.0–17.0)
Immature Granulocytes: 0 %
Lymphocytes Relative: 10 %
Lymphs Abs: 0.7 10*3/uL (ref 0.7–4.0)
MCH: 31.1 pg (ref 26.0–34.0)
MCHC: 34.2 g/dL (ref 30.0–36.0)
MCV: 90.9 fL (ref 80.0–100.0)
Monocytes Absolute: 0.7 10*3/uL (ref 0.1–1.0)
Monocytes Relative: 10 %
Neutro Abs: 5.7 10*3/uL (ref 1.7–7.7)
Neutrophils Relative %: 80 %
Platelets: 219 10*3/uL (ref 150–400)
RBC: 4.08 MIL/uL — ABNORMAL LOW (ref 4.22–5.81)
RDW: 12.4 % (ref 11.5–15.5)
WBC: 7.2 10*3/uL (ref 4.0–10.5)
nRBC: 0 % (ref 0.0–0.2)

## 2019-11-27 LAB — RAPID URINE DRUG SCREEN, HOSP PERFORMED
Amphetamines: NOT DETECTED
Barbiturates: NOT DETECTED
Benzodiazepines: NOT DETECTED
Cocaine: NOT DETECTED
Opiates: NOT DETECTED
Tetrahydrocannabinol: NOT DETECTED

## 2019-11-27 LAB — TROPONIN I (HIGH SENSITIVITY)
Troponin I (High Sensitivity): 5 ng/L (ref <18)
Troponin I (High Sensitivity): 5 ng/L (ref <18)

## 2019-11-27 LAB — POC SARS CORONAVIRUS 2 AG -  ED: SARS Coronavirus 2 Ag: POSITIVE — AB

## 2019-11-27 LAB — D-DIMER, QUANTITATIVE: D-Dimer, Quant: 2.96 ug/mL-FEU — ABNORMAL HIGH (ref 0.00–0.50)

## 2019-11-27 LAB — LIPASE, BLOOD: Lipase: 23 U/L (ref 11–51)

## 2019-11-27 MED ORDER — IOHEXOL 350 MG/ML SOLN
100.0000 mL | Freq: Once | INTRAVENOUS | Status: AC | PRN
  Administered 2019-11-27: 18:00:00 100 mL via INTRAVENOUS

## 2019-11-27 MED ORDER — IBUPROFEN 800 MG PO TABS: 800.0000 mg | ORAL_TABLET | Freq: Three times a day (TID) | ORAL | 0 refills | Status: DC

## 2019-11-27 MED ORDER — SODIUM CHLORIDE 0.9 % IV BOLUS
500.0000 mL | Freq: Once | INTRAVENOUS | Status: AC
  Administered 2019-11-27: 17:00:00 500 mL via INTRAVENOUS

## 2019-11-27 MED ORDER — ONDANSETRON 4 MG PO TBDP: 4.0000 mg | ORAL_TABLET | Freq: Three times a day (TID) | ORAL | 0 refills | Status: DC | PRN

## 2019-11-27 MED ORDER — SODIUM CHLORIDE (PF) 0.9 % IJ SOLN
INTRAMUSCULAR | Status: AC
  Filled 2019-11-27: qty 50

## 2019-11-27 MED ORDER — BENZONATATE 100 MG PO CAPS: 100.0000 mg | ORAL_CAPSULE | Freq: Three times a day (TID) | ORAL | 0 refills | Status: DC

## 2019-11-27 MED ORDER — ACETAMINOPHEN 325 MG PO TABS
650.0000 mg | ORAL_TABLET | Freq: Once | ORAL | Status: AC
  Administered 2019-11-27: 650 mg via ORAL
  Filled 2019-11-27: qty 2

## 2019-11-27 NOTE — ED Notes (Signed)
[facility] public health department is sending transportation for the patient back home. They estimate it will be around 2130 before they get here

## 2019-11-27 NOTE — ED Notes (Signed)
RN ambulated pt in the room. O2 saturation remained between 92-96% during ambulation. Patient reports increased SOB, but is able to tolerate walking in place.

## 2019-11-27 NOTE — Discharge Instructions (Signed)
As discussed, all your labs and scans looked good today. Your symptoms are most likely related to COVID infection. Continue to self quarantine away from others. I am sending you home with cough medication, nausea medication, and ibuprofen.  Take as prescribed.  Follow-up with PCP if her symptoms do not improve within the next week.  Return to the ED for new or worsening symptoms.

## 2019-11-27 NOTE — ED Triage Notes (Signed)
Per EMS: Patient reportedly tested positive for COVID on Monday. Patient reports cough, SOB, fatigue. Patient is 96% on RA with EMS

## 2019-11-27 NOTE — ED Provider Notes (Signed)
[place] DEPT Provider Note   CSN: [id number] Arrival date & time: 11/27/19  1327     History Chief Complaint  Patient presents with  . COVID Positive  . Cough    Leonard Sweeney is a 49 y.o. male with a past medical history significant for anxiety and depression who presents to the ED via EMS due to consistent cough, shortness of breath, and fatigue. Patient states he tested positive for COVID on Monday, but he is unsure where he was tested. Admits to intermittent dry cough. Denies chest pain. He admits to shortness of breath both with and without exertion. No history of asthma. Denies fever and chills. He also endorses right sided abdominal pain since Monday. Denies associated nausea, vomiting, and diarrhea. He has tried Tylenol for his symptoms with moderate relief.   Upon further questioning, patient notes he got up to get something to drink prior to arrival and felt acutely dizzy and laid on the floor. He admits to checking the clock and 15 minutes had passed. Unsure if he fully loss consciousness. Denies history of blood clots, recent surgeries, recent long immobilizations, and hormonal treatments.     Past Medical History:  Diagnosis Date  . Anxiety   . Depression     Patient Active Problem List   Diagnosis Date Noted  . 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 Fracture, [place], right, closed 06/13/2019    No past surgical history on file.     No family history on file.  Social History   Tobacco Use  . Smoking status: Former Smoker    Types: Cigarettes  . Smokeless tobacco: Never Used  Substance Use Topics  . Alcohol use: Yes    Comment: quart of beer several times a week  . Drug use: Yes    Types: Cocaine    Home Medications Prior to Admission medications   Medication Sig Start Date End Date Taking? Authorizing Provider  benzonatate (TESSALON) 100 MG capsule Take 1 capsule (100 mg total) by mouth every 8 (eight)  hours. 11/27/19   Suzy Bouchard, PA-C  gabapentin (NEURONTIN) 300 MG capsule Take 1 capsule (300 mg total) by mouth 2 (two) times daily. 10/01/19   Emmaline Kluver, FNP  ibuprofen (ADVIL) 800 MG tablet Take 1 tablet (800 mg total) by mouth 3 (three) times daily. 11/27/19   Suzy Bouchard, PA-C  nabumetone (RELAFEN) 750 MG tablet Take 1 tablet (750 mg total) by mouth 2 (two) times daily as needed. Patient not taking: Reported on 09/30/2019 07/13/19   Mcarthur Rossetti, MD  ondansetron (ZOFRAN ODT) 4 MG disintegrating tablet Take 1 tablet (4 mg total) by mouth every 8 (eight) hours as needed for nausea or vomiting. 11/27/19   Suzy Bouchard, PA-C    Allergies    Patient has no known allergies.  Review of Systems   Review of Systems  Constitutional: Negative for chills and fever.  Respiratory: Positive for cough and shortness of breath.   Cardiovascular: Negative for chest pain and leg swelling.  Gastrointestinal: Positive for abdominal pain. Negative for diarrhea, nausea and vomiting.  Neurological: Negative for headaches.  All other systems reviewed and are negative.   Physical Exam Updated Vital Signs BP (!) 162/71 (BP Location: Left Arm)   Pulse 90   Temp 98.8 F (37.1 C) (Oral)   Resp (!) 22   Ht 5\' 11"  (1.803 m)   Wt 99.8 kg   SpO2 92%   BMI 30.68 kg/m  Physical Exam Vitals and nursing note reviewed.  Constitutional:      General: He is not in acute distress.    Appearance: He is not toxic-appearing.  HENT:     Head: Normocephalic.  Eyes:     Pupils: Pupils are equal, round, and reactive to light.  Cardiovascular:     Rate and Rhythm: Normal rate and regular rhythm.     Pulses: Normal pulses.     Heart sounds: Normal heart sounds. No murmur. No friction rub. No gallop.   Pulmonary:     Effort: Pulmonary effort is normal.     Breath sounds: Normal breath sounds.     Comments: Respirations equal and mild increase in work of breathing, patient able to  speak in full sentences, lungs clear to auscultation bilaterally  Abdominal:     General: Abdomen is flat. Bowel sounds are normal. There is no distension.     Palpations: Abdomen is soft.     Tenderness: There is abdominal tenderness. There is no guarding or rebound.     Comments: Mild tenderness to deep palpation in right flank region. No tenderness at McBurney's point.   Musculoskeletal:     Cervical back: Neck supple.     Comments: Able to move all 4 extremities without difficulty. No lower extremity edema. Negative homans sign bilaterally.   Skin:    General: Skin is warm and dry.  Neurological:     General: No focal deficit present.     Mental Status: He is alert.  Psychiatric:        Mood and Affect: Mood normal.        Behavior: Behavior normal.     ED Results / Procedures / Treatments   Labs (all labs ordered are listed, but only abnormal results are displayed) Labs Reviewed  CBC WITH DIFFERENTIAL/PLATELET - Abnormal; Notable for the following components:      Result Value   RBC 4.08 (*)    Hemoglobin 12.7 (*)    HCT 37.1 (*)    All other components within normal limits  COMPREHENSIVE METABOLIC PANEL - Abnormal; Notable for the following components:   Sodium 134 (*)    CO2 21 (*)    Glucose, Bld 120 (*)    All other components within normal limits  D-DIMER, QUANTITATIVE (NOT AT [facility]) - Abnormal; Notable for the following components:   D-Dimer, Quant 2.96 (*)    All other components within normal limits  URINALYSIS, ROUTINE W REFLEX MICROSCOPIC - Abnormal; Notable for the following components:   Hgb urine dipstick SMALL (*)    All other components within normal limits  POC SARS CORONAVIRUS 2 AG -  ED - Abnormal; Notable for the following components:   SARS Coronavirus 2 Ag POSITIVE (*)    All other components within normal limits  RAPID URINE DRUG SCREEN, HOSP PERFORMED  LIPASE, BLOOD  TROPONIN I (HIGH SENSITIVITY)  TROPONIN I (HIGH SENSITIVITY)    EKG EKG  Interpretation  Date/Time:  Sunday November 27 2019 13:57:55 EST Ventricular Rate:  90 PR Interval:    QRS Duration: 106 QT Interval:  341 QTC Calculation: 418 R Axis:   5 Text Interpretation: Sinus rhythm Consider left atrial enlargement LVH with secondary repolarization abnormality Anterior ST elevation, probably due to LVH No significant change was found Confirmed by Glynn Octave [phone]) on 11/27/2019 2:40:41 PM   Radiology CT Angio Chest PE W and/or Wo Contrast  Result Date: 11/27/2019 CLINICAL DATA:  Shortness of breath. Elevated D-dimer.  COVID positive. EXAM: CT ANGIOGRAPHY CHEST WITH CONTRAST TECHNIQUE: Multidetector CT imaging of the chest was performed using the standard protocol during bolus administration of intravenous contrast. Multiplanar CT image reconstructions and MIPs were obtained to evaluate the vascular anatomy. CONTRAST:  OMNIPAQUE IOHEXOL 350 MG/ML SOLN COMPARISON:  None. FINDINGS: Cardiovascular: Evaluation for pulmonary emboli is limited by respiratory motion artifact and suboptimal contrast bolus timing.Given these limitations, no acute pulmonary embolism was detected. The main pulmonary artery is within normal limits for size. There is no CT evidence of acute right heart strain. The visualized aorta is normal. Heart size is normal, without pericardial effusion. Mediastinum/Nodes: --there are multiple enlarged mediastinal and hilar lymph nodes. The large lymph node is in the right paratracheal region measures approximately 1.6 cm in the short axis. --No axillary lymphadenopathy. --No supraclavicular lymphadenopathy. --Normal thyroid gland. --The esophagus is unremarkable Lungs/Pleura: Diffuse bilateral hazy ground-glass airspace opacities are noted. There is no pneumothorax or large pleural effusion. There is a small amount of debris within the upper trachea. Incidentally noted are 3 lobes within the left lung, a normal variant. Upper Abdomen: No acute abnormality.  Musculoskeletal: No chest wall abnormality. No acute or significant osseous findings. Review of the MIP images confirms the above findings. IMPRESSION: 1. Evaluation for pulmonary emboli is limited as detailed above. Given these limitations no acute pulmonary embolus was detected. 2. Diffuse bilateral airspace opacities consistent with the patient's history of viral pneumonia. 3. Enlarged mediastinal hilar lymph nodes favored to be reactive. Consider a three-month follow-up CT to confirm resolution or stability. 4. Electronically Signed   By: Katherine Mantle M.D.   On: 11/27/2019 18:44   DG Chest Portable 1 View  Result Date: 11/27/2019 CLINICAL DATA:  COVID positive, shortness of breath, fatigue EXAM: PORTABLE CHEST 1 VIEW COMPARISON:  12/29/2016 FINDINGS: The heart size and mediastinal contours are within normal limits. Heterogeneous airspace opacities, most conspicuous in the right midlung. The visualized skeletal structures are unremarkable. IMPRESSION: Heterogeneous airspace opacities, most conspicuous in the right midlung, and in keeping with reported COVID-19 diagnosis. Electronically Signed   By: Lauralyn Primes M.D.   On: 11/27/2019 14:49    Procedures Procedures (including critical care time)  Medications Ordered in ED Medications  sodium chloride (PF) 0.9 % injection (has no administration in time range)  sodium chloride 0.9 % bolus 500 mL (0 mLs Intravenous Stopped 11/27/19 1827)  iohexol (OMNIPAQUE) 350 MG/ML injection 100 mL (100 mLs Intravenous Contrast Given 11/27/19 1819)  acetaminophen (TYLENOL) tablet 650 mg (650 mg Oral Given 11/27/19 2001)    ED Course  I have reviewed the triage vital signs and the nursing notes.  Pertinent labs & imaging results that were available during my care of the patient were reviewed by me and considered in my medical decision making (see chart for details).  Clinical Course as of Nov 26 2016  Wynelle Link Nov 27, 2019  1740 SARS Coronavirus 2 Ag(!):  POSITIVE [CA]  1740 Troponin I (High Sensitivity): 5 [CA]  1926 D-Dimer, Quant(!): 2.96 [CA]  2011 Troponin I (High Sensitivity): 5 [CA]    Clinical Course User Index [CA] Mannie Stabile, PA-C   MDM Rules/Calculators/A&P                     49 year old male presents to the ED due to shortness of breath, cough, and fatigue.  Per the patient, he tested positive for Covid on Monday however he is unsure where he was tested.  No confirmed  Covid test in chart.  Patient denies chest pain and lower extremity edema.  Upon arrival patient is afebrile, not tachycardic, mild tachypnea at 22.  O2 saturation has ranged between 92 to 96% on room air during his ED stay.  In no acute distress and nontoxic-appearing.  Will obtain routine labs, chest x-ray, and EKG. Suspect symptoms related to COVID infection.  CBC reassuring with no leukocytosis.  CMP reassuring with normal renal function.  EKG personally reviewed which demonstrates normal sinus rhythm with left ventricular hypertrophy.  No signs of acute ischemia.  Chest x-ray personally reviewed which demonstrates: IMPRESSION:  Heterogeneous airspace opacities, most conspicuous in the right  midlung, and in keeping with reported COVID-19 diagnosis.   Upon further questioning, patient notes he felt acutely dizzy and may or may not have passed out earlier today. Will obtain further labs: d-dimer and troponin. Will give fluid. Discussed case with Dr. Manus Gunning who evaluated patient at bedside and agrees with assessment and plan.   D-dimer elevated at 2.96, most likely due to COVID infection; however, given his shortness of breath will obtain CTA to rule out PE. Initial troponin normal. Will obtain delta. Lipase normal. Doubt pancreatitis.   CTA personally reviewed which demonstrates: 1. Evaluation for pulmonary emboli is limited as detailed above.  Given these limitations no acute pulmonary embolus was detected.  2. Diffuse bilateral airspace opacities  consistent with the  patient's history of viral pneumonia.  3. Enlarged mediastinal hilar lymph nodes favored to be reactive.  Consider a three-month follow-up CT to confirm resolution or  stability.   Delta troponin flat. Doubt ACS. No concern for PE given negative CTA. Suspect symptoms related to COVID infection. Will discharge patient with symptomatic treatment. Advised patient to follow-up with PCP if symptoms do not improve within the next week. Strict ED precautions discussed with patient. Patient states understanding and agrees to plan. Patient discharged home in no acute distress and stable vitals.  Leonard Sweeney was evaluated in Emergency Department on 11/27/2019 for the symptoms described in the history of present illness. He was evaluated in the context of the global COVID-19 pandemic, which necessitated consideration that the patient might be at risk for infection with the SARS-CoV-2 virus that causes COVID-19. Institutional protocols and algorithms that pertain to the evaluation of patients at risk for COVID-19 are in a state of rapid change based on information released by regulatory bodies including the CDC and federal and state organizations. These policies and algorithms were followed during the patient's care in the ED.  Final Clinical Impression(s) / ED Diagnoses Final diagnoses:  COVID-19 virus infection  Shortness of breath    Rx / DC Orders ED Discharge Orders         Ordered    benzonatate (TESSALON) 100 MG capsule  Every 8 hours     11/27/19 2015    ondansetron (ZOFRAN ODT) 4 MG disintegrating tablet  Every 8 hours PRN     11/27/19 2015    ibuprofen (ADVIL) 800 MG tablet  3 times daily     11/27/19 2015           Jesusita Oka 11/27/19 2018    Glynn Octave, MD 11/28/19 [phone]

## 2019-11-29 ENCOUNTER — Encounter (HOSPITAL_COMMUNITY): Payer: Self-pay | Admitting: *Deleted

## 2019-11-29 ENCOUNTER — Emergency Department (HOSPITAL_COMMUNITY)
Admission: EM | Admit: 2019-11-29 | Discharge: 2019-11-29 | Disposition: A | Discharge status: Home / Self Care | Payer: Self-pay | Attending: Emergency Medicine | Admitting: Emergency Medicine

## 2019-11-29 ENCOUNTER — Other Ambulatory Visit: Payer: Self-pay

## 2019-11-29 ENCOUNTER — Emergency Department (HOSPITAL_COMMUNITY): Payer: Self-pay

## 2019-11-29 DIAGNOSIS — R0602 Shortness of breath: Secondary | ICD-10-CM | POA: Insufficient documentation

## 2019-11-29 DIAGNOSIS — Z79899 Other long term (current) drug therapy: Secondary | ICD-10-CM | POA: Insufficient documentation

## 2019-11-29 LAB — BASIC METABOLIC PANEL
Anion gap: 12 (ref 5–15)
BUN: 21 mg/dL — ABNORMAL HIGH (ref 6–20)
CO2: 23 mmol/L (ref 22–32)
Calcium: 8.8 mg/dL — ABNORMAL LOW (ref 8.9–10.3)
Chloride: 100 mmol/L (ref 98–111)
Creatinine, Ser: 1.34 mg/dL — ABNORMAL HIGH (ref 0.61–1.24)
GFR calc Af Amer: >60 mL/min (ref >60)
GFR calc non Af Amer: >60 mL/min (ref >60)
Glucose, Bld: 118 mg/dL — ABNORMAL HIGH (ref 70–99)
Potassium: 4.1 mmol/L (ref 3.5–5.1)
Sodium: 135 mmol/L (ref 135–145)

## 2019-11-29 LAB — CBC WITH DIFFERENTIAL/PLATELET
Abs Immature Granulocytes: 0.02 10*3/uL (ref 0.00–0.07)
Basophils Absolute: 0 10*3/uL (ref 0.0–0.1)
Basophils Relative: 0 %
Eosinophils Absolute: 0 10*3/uL (ref 0.0–0.5)
Eosinophils Relative: 1 %
HCT: 35.2 % — ABNORMAL LOW (ref 39.0–52.0)
Hemoglobin: 12 g/dL — ABNORMAL LOW (ref 13.0–17.0)
Immature Granulocytes: 0 %
Lymphocytes Relative: 11 %
Lymphs Abs: 0.7 10*3/uL (ref 0.7–4.0)
MCH: 30.8 pg (ref 26.0–34.0)
MCHC: 34.1 g/dL (ref 30.0–36.0)
MCV: 90.5 fL (ref 80.0–100.0)
Monocytes Absolute: 0.7 10*3/uL (ref 0.1–1.0)
Monocytes Relative: 11 %
Neutro Abs: 5.1 10*3/uL (ref 1.7–7.7)
Neutrophils Relative %: 77 %
Platelets: 227 10*3/uL (ref 150–400)
RBC: 3.89 MIL/uL — ABNORMAL LOW (ref 4.22–5.81)
RDW: 12.6 % (ref 11.5–15.5)
WBC: 6.6 10*3/uL (ref 4.0–10.5)
nRBC: 0 % (ref 0.0–0.2)

## 2019-11-29 MED ORDER — ALBUTEROL SULFATE HFA 108 (90 BASE) MCG/ACT IN AERS
4.0000 | INHALATION_SPRAY | Freq: Once | RESPIRATORY_TRACT | Status: AC
  Administered 2019-11-29: 4 via RESPIRATORY_TRACT
  Filled 2019-11-29: qty 6.7

## 2019-11-29 MED ORDER — SODIUM CHLORIDE 0.9 % IV BOLUS
500.0000 mL | Freq: Once | INTRAVENOUS | Status: AC
  Administered 2019-11-29: 500 mL via INTRAVENOUS

## 2019-11-29 MED ORDER — DEXAMETHASONE SODIUM PHOSPHATE 10 MG/ML IJ SOLN
10.0000 mg | Freq: Once | INTRAMUSCULAR | Status: AC
  Administered 2019-11-29: 10 mg via INTRAVENOUS
  Filled 2019-11-29: qty 1

## 2019-11-29 NOTE — ED Provider Notes (Signed)
[facility] EMERGENCY DEPARTMENT Provider Note   CSN: [id number] Arrival date & time: 11/29/19  1326     History Chief Complaint  Patient presents with  . Shortness of Breath    Leonard Sweeney is a 49 y.o. male.  HPI      Leonard Sweeney is a 49 y.o. male, with a history of anxiety and depression, presenting to the ED with shortness of breath intermittent for the last week.  He had an episode of shortness of breath today that was worse than previous episodes.  It lasted for several minutes, he called 911, but the increased shortness of breath has since resolved. He has had symptoms of cough, fatigue, chills, and shortness of breath for at least the last week.  He tested positive for COVID-19 at an outpatient facility at the beginning of his symptoms.  This test was confirmed positive during his ED visit on February 28.  Denies chest pain, abdominal pain, vomiting, persistent diarrhea, syncope, lower extremity pain/swelling, or any other complaints.   Past Medical History:  Diagnosis Date  . Anxiety   . Depression     Patient Active Problem List   Diagnosis Date Noted  . Cocaine-induced mood disorder ([facility]) 09/30/2019  . Fracture, [place], right, closed 06/13/2019    History reviewed. No pertinent surgical history.     History reviewed. No pertinent family history.  Social History   Tobacco Use  . Smoking status: Never Smoker  . Smokeless tobacco: Never Used  Substance Use Topics  . Alcohol use: Not Currently    Comment: quart of beer several times a week  . Drug use: Not Currently    Home Medications Prior to Admission medications   Medication Sig Start Date End Date Taking? Authorizing Provider  acetaminophen (TYLENOL) 325 MG tablet Take 650 mg by mouth every 6 (six) hours as needed for moderate pain.   Yes [provider]  benzonatate (TESSALON) 100 MG capsule Take 1 capsule (100 mg total) by mouth every 8 (eight) hours. 11/27/19  Yes  Aberman, Merla Riches, PA-C  ibuprofen (ADVIL) 800 MG tablet Take 1 tablet (800 mg total) by mouth 3 (three) times daily. 11/27/19  Yes Aberman, Caroline C, PA-C  ondansetron (ZOFRAN ODT) 4 MG disintegrating tablet Take 1 tablet (4 mg total) by mouth every 8 (eight) hours as needed for nausea or vomiting. 11/27/19  Yes Aberman, Caroline C, PA-C  gabapentin (NEURONTIN) 300 MG capsule Take 1 capsule (300 mg total) by mouth 2 (two) times daily. Patient not taking: Reported on 11/29/2019 10/01/19   Patrcia Dolly, FNP  nabumetone (RELAFEN) 750 MG tablet Take 1 tablet (750 mg total) by mouth 2 (two) times daily as needed. Patient not taking: Reported on 09/30/2019 07/13/19   Kathryne Hitch, MD    Allergies    Patient has no known allergies.  Review of Systems   Review of Systems  Constitutional: Positive for chills and fatigue.  Respiratory: Positive for cough and shortness of breath.   Cardiovascular: Negative for chest pain and leg swelling.  Gastrointestinal: Negative for abdominal pain, diarrhea, nausea and vomiting.  All other systems reviewed and are negative.   Physical Exam Updated Vital Signs BP 136/77 (BP Location: Right Arm)   Pulse (!) 104   Temp 99.9 F (37.7 C) (Oral)   Resp (!) 26   Ht 5\' 11"  (1.803 m)   Wt 99.7 kg   SpO2 97%   BMI 30.67 kg/m   Physical Exam Vitals  and nursing note reviewed.  Constitutional:      General: He is not in acute distress.    Appearance: He is well-developed. He is not diaphoretic.  HENT:     Head: Normocephalic and atraumatic.     Mouth/Throat:     Mouth: Mucous membranes are moist.     Pharynx: Oropharynx is clear.  Eyes:     Conjunctiva/sclera: Conjunctivae normal.  Cardiovascular:     Rate and Rhythm: Normal rate and regular rhythm.     Pulses: Normal pulses.          Radial pulses are 2+ on the right side and 2+ on the left side.       Posterior tibial pulses are 2+ on the right side and 2+ on the left side.     Heart sounds:  Normal heart sounds.     Comments: Tactile temperature in the extremities appropriate and equal bilaterally. Pulse rate of 96 during my assessment. Pulmonary:     Effort: Tachypnea present.     Breath sounds: Normal breath sounds.  Abdominal:     Palpations: Abdomen is soft.     Tenderness: There is no abdominal tenderness. There is no guarding.  Musculoskeletal:     Cervical back: Neck supple.     Right lower leg: No edema.     Left lower leg: No edema.  Lymphadenopathy:     Cervical: No cervical adenopathy.  Skin:    General: Skin is warm and dry.  Neurological:     Mental Status: He is alert.  Psychiatric:        Mood and Affect: Mood and affect normal.        Speech: Speech normal.        Behavior: Behavior normal.     ED Results / Procedures / Treatments   Labs (all labs ordered are listed, but only abnormal results are displayed) Labs Reviewed  BASIC METABOLIC PANEL - Abnormal; Notable for the following components:      Result Value   Glucose, Bld 118 (*)    BUN 21 (*)    Creatinine, Ser 1.34 (*)    Calcium 8.8 (*)    All other components within normal limits  CBC WITH DIFFERENTIAL/PLATELET - Abnormal; Notable for the following components:   RBC 3.89 (*)    Hemoglobin 12.0 (*)    HCT 35.2 (*)    All other components within normal limits    EKG EKG Interpretation  Date/Time:  Tuesday November 29 2019 13:33:08 EST Ventricular Rate:  106 PR Interval:    QRS Duration: 96 QT Interval:  315 QTC Calculation: 419 R Axis:   4 Text Interpretation: Sinus tachycardia Abnormal R-wave progression, early transition LVH with secondary repolarization abnormality Confirmed by Virgel Manifold [phone]) on 11/29/2019 2:21:16 PM   Radiology CT Angio Chest PE W and/or Wo Contrast  Result Date: 11/27/2019 CLINICAL DATA:  Shortness of breath. Elevated D-dimer. COVID positive. EXAM: CT ANGIOGRAPHY CHEST WITH CONTRAST TECHNIQUE: Multidetector CT imaging of the chest was performed  using the standard protocol during bolus administration of intravenous contrast. Multiplanar CT image reconstructions and MIPs were obtained to evaluate the vascular anatomy. CONTRAST:  1110mL OMNIPAQUE IOHEXOL 350 MG/ML SOLN COMPARISON:  None. FINDINGS: Cardiovascular: Evaluation for pulmonary emboli is limited by respiratory motion artifact and suboptimal contrast bolus timing.Given these limitations, no acute pulmonary embolism was detected. The main pulmonary artery is within normal limits for size. There is no CT evidence of acute right heart strain.  The visualized aorta is normal. Heart size is normal, without pericardial effusion. Mediastinum/Nodes: --there are multiple enlarged mediastinal and hilar lymph nodes. The large lymph node is in the right paratracheal region measures approximately 1.6 cm in the short axis. --No axillary lymphadenopathy. --No supraclavicular lymphadenopathy. --Normal thyroid gland. --The esophagus is unremarkable Lungs/Pleura: Diffuse bilateral hazy ground-glass airspace opacities are noted. There is no pneumothorax or large pleural effusion. There is a small amount of debris within the upper trachea. Incidentally noted are 3 lobes within the left lung, a normal variant. Upper Abdomen: No acute abnormality. Musculoskeletal: No chest wall abnormality. No acute or significant osseous findings. Review of the MIP images confirms the above findings. IMPRESSION: 1. Evaluation for pulmonary emboli is limited as detailed above. Given these limitations no acute pulmonary embolus was detected. 2. Diffuse bilateral airspace opacities consistent with the patient's history of viral pneumonia. 3. Enlarged mediastinal hilar lymph nodes favored to be reactive. Consider a three-month follow-up CT to confirm resolution or stability. 4. Electronically Signed   By: Katherine Mantle M.D.   On: 11/27/2019 18:44   DG Chest Port 1 View  Result Date: 11/29/2019 CLINICAL DATA:  Cough.  COVID-19 EXAM:  PORTABLE CHEST 1 VIEW COMPARISON:  11/27/2019 FINDINGS: Bibasilar infiltrates with mild progression bilaterally. No effusion. Upper lobes clear. IMPRESSION: Mild progression of bibasilar infiltrates compatible with pneumonia. Electronically Signed   By: Marlan Palau M.D.   On: 11/29/2019 14:31    Procedures Procedures (including critical care time)  Medications Ordered in ED Medications  dexamethasone (DECADRON) injection 10 mg (10 mg Intravenous Given 11/29/19 1551)  albuterol (VENTOLIN HFA) 108 (90 Base) MCG/ACT inhaler 4 puff (4 puffs Inhalation Given 11/29/19 1548)  sodium chloride 0.9 % bolus 500 mL (0 mLs Intravenous Stopped 11/29/19 1658)    ED Course  I have reviewed the triage vital signs and the nursing notes.  Pertinent labs & imaging results that were available during my care of the patient were reviewed by me and considered in my medical decision making (see chart for details).  Clinical Course as of Nov 29 1710  Tue Nov 29, 2019  1645 Patient states he feels much better.  Patient no longer tachypneic.  No tachycardia.  No increased work of breathing.  Speaks in full sentences without noted difficulty.  SPO2 98 to 99% on room air.   [SJ]    Clinical Course User Index [SJ] Lakely Elmendorf, Hillard Danker, PA-C   MDM Rules/Calculators/A&P                      Patient presents with an episode of shortness of breath.  Nontoxic-appearing.  He did have some tachypnea upon initial presentation, but this resolved following albuterol inhaler use. Small elevation in creatinine and BUN, suspect some dehydration.  This was communicated with the patient. Patient ambulated without shortness of breath, chest pain, or onset of other symptoms and while maintaining 98 to 100% on room air. I reviewed patient's chart, including note, labs, and imaging from previous ED visit. The patient was given instructions for home care as well as return precautions. Patient voices understanding of these instructions, accepts  the plan, and is comfortable with discharge.  I reviewed and interpreted the patient's labs and radiological studies.  Vitals:   11/29/19 1515 11/29/19 1600 11/29/19 1630 11/29/19 1645  BP:  128/73 137/88   Pulse: 96 92 87 90  Resp: 12 11 (!) 27 (!) 21  Temp:      TempSrc:  SpO2: 92% 95% 95% 96%  Weight:      Height:        Final Clinical Impression(s) / ED Diagnoses Final diagnoses:  Shortness of breath    Rx / DC Orders ED Discharge Orders    None       Concepcion Living 11/29/19 1712    Melene Plan, DO 11/29/19 1739

## 2019-11-29 NOTE — Discharge Instructions (Addendum)
Your symptoms are still consistent with a COVID-19 infection, which is due to a virus.  Viruses do not require or respond to antibiotics. Treatment is symptomatic care and it is important to note that these symptoms may last for 7-14 days.   Hand washing: Wash your hands throughout the day, but especially before and after touching the face, using the restroom, sneezing, coughing, or touching surfaces that have been coughed or sneezed upon. Hydration: Symptoms of most illnesses will be intensified and complicated by dehydration. Dehydration can also extend the duration of symptoms. Drink plenty of fluids and get plenty of rest. You should be drinking at least half a liter of water an hour to stay hydrated. Electrolyte drinks (ex. Gatorade, Powerade, Pedialyte) are also encouraged. You should be drinking enough fluids to make your urine light yellow, almost clear. If this is not the case, you are not drinking enough water. Please note that some of the treatments indicated below will not be effective if you are not adequately hydrated. Diet: Please concentrate on hydration, however, you may introduce food slowly.  Start with a clear liquid diet, progressed to a full liquid diet, and then bland solids as you are able. Pain or fever: Ibuprofen, Naproxen, or acetaminophen (generic for Tylenol) for pain or fever.  Antiinflammatory medications: Take 600 mg of ibuprofen every 6 hours or 440 mg (over the counter dose) to 500 mg (prescription dose) of naproxen every 12 hours for the next 3 days. After this time, these medications may be used as needed for pain. Take these medications with food to avoid upset stomach. Choose only one of these medications, do not take them together. Acetaminophen (generic for Tylenol): Should you continue to have additional pain while taking the ibuprofen or naproxen, you may add in acetaminophen as needed. Your daily total maximum amount of acetaminophen from all sources should be  limited to 4000mg /day for persons without liver problems, or 2000mg /day for those with liver problems. Nausea/vomiting: Use the ondansetron (generic for Zofran) for nausea or vomiting.  This medication may not prevent all vomiting or nausea, but can help facilitate better hydration. Things that can help with nausea/vomiting also include peppermint/menthol candies, vitamin B12, and ginger. Diarrhea: May use medications such as loperamide (Imodium) or Bismuth subsalicylate (Pepto-Bismol). Cough: Use the benzonatate (generic for Tessalon) for cough.  Teas, warm liquids, broths, and honey can also help with cough. Albuterol: May use the albuterol as needed for instances of shortness of breath. Zyrtec or Claritin: May add these medication daily to control underlying symptoms of congestion, sneezing, and other signs of allergies.  These medications are available over-the-counter. Generics: Cetirizine (generic for Zyrtec) and loratadine (generic for Claritin). Fluticasone: Use fluticasone (generic for Flonase), as directed, for nasal and sinus congestion.  This medication is available over-the-counter. Congestion: Plain guaifenesin (generic for plain Mucinex) may help relieve congestion. Saline sinus rinses and saline nasal sprays may also help relieve congestion. If you do not have high blood pressure, heart problems, or an allergy to such medications, you may also try phenylephrine or Sudafed. Sore throat: Warm liquids or Chloraseptic spray may help soothe a sore throat. Gargle twice a day with a salt water solution made from a half teaspoon of salt in a cup of warm water.  Follow up: Follow up with a primary care provider within the next two weeks should symptoms fail to resolve. Return: Return to the ED for significantly worsening symptoms, shortness of breath, persistent vomiting, large amounts of blood in stool,  or any other major concerns.  For prescription assistance, may try using prescription  discount sites or apps, such as goodrx.com    Recommend repeat chest CT in 3 months to assure resolution of lung abnormalities.

## 2019-11-29 NOTE — ED Triage Notes (Signed)
PT reports SHOB is on and off . Pt reports he took Tylenol at lunch today. Pt SPO2 on room air 96 % per EMS.

## 2019-11-29 NOTE — ED Notes (Signed)
Pt ambulated around the room just fine. Pt appeared to be in no type of distress. Pt maintained O2 saturations of 98-100% while up ambulating.

## 2020-12-21 IMAGING — DX DG CHEST 1V PORT
1 series · 1 of 1 positions shown · non-contrast
Comparison: 11/27/2019

CLINICAL DATA: Cough.  [id number]

EXAM:
PORTABLE CHEST 1 VIEW

[chest]
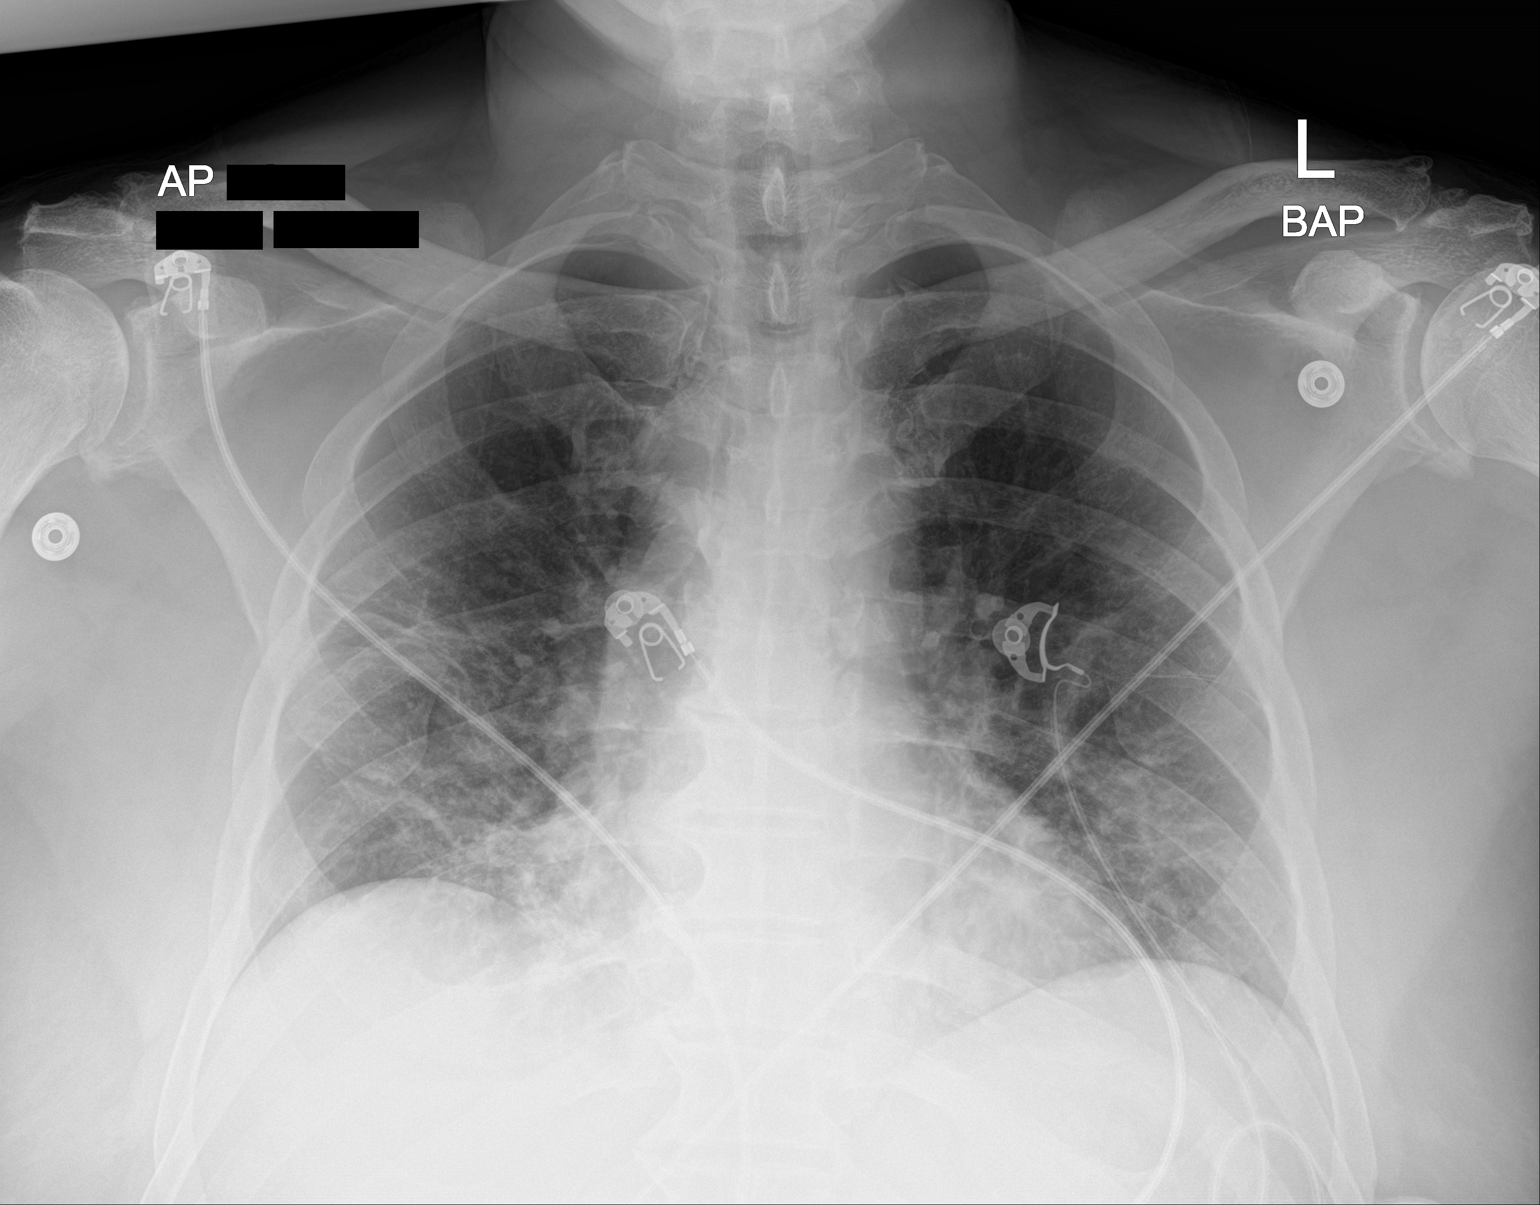

[1 of 1 positions shown; findings below may reference images not displayed]

FINDINGS: Bibasilar infiltrates with mild progression bilaterally. No
effusion. Upper lobes clear.
IMPRESSION: Mild progression of bibasilar infiltrates compatible with pneumonia.

## 2021-05-13 ENCOUNTER — Ambulatory Visit (HOSPITAL_COMMUNITY)
Admission: EM | Admit: 2021-05-13 | Discharge: 2021-05-13 | Disposition: A | Discharge status: Home / Self Care | Payer: Self-pay | Attending: Internal Medicine | Admitting: Internal Medicine

## 2021-05-13 ENCOUNTER — Other Ambulatory Visit: Payer: Self-pay

## 2021-05-13 ENCOUNTER — Encounter (HOSPITAL_COMMUNITY): Payer: Self-pay | Admitting: *Deleted

## 2021-05-13 DIAGNOSIS — R21 Rash and other nonspecific skin eruption: Secondary | ICD-10-CM | POA: Insufficient documentation

## 2021-05-13 LAB — CBC
HCT: 35.8 % — ABNORMAL LOW (ref 39.0–52.0)
Hemoglobin: 12.1 g/dL — ABNORMAL LOW (ref 13.0–17.0)
MCH: 30.8 pg (ref 26.0–34.0)
MCHC: 33.8 g/dL (ref 30.0–36.0)
MCV: 91.1 fL (ref 80.0–100.0)
Platelets: 228 10*3/uL (ref 150–400)
RBC: 3.93 MIL/uL — ABNORMAL LOW (ref 4.22–5.81)
RDW: 14 % (ref 11.5–15.5)
WBC: 6.2 10*3/uL (ref 4.0–10.5)
nRBC: 0 % (ref 0.0–0.2)

## 2021-05-13 LAB — BASIC METABOLIC PANEL
Anion gap: 7 (ref 5–15)
BUN: 10 mg/dL (ref 6–20)
CO2: 26 mmol/L (ref 22–32)
Calcium: 9.2 mg/dL (ref 8.9–10.3)
Chloride: 106 mmol/L (ref 98–111)
Creatinine, Ser: 1.08 mg/dL (ref 0.61–1.24)
GFR, Estimated: >60 mL/min (ref >60)
Glucose, Bld: 123 mg/dL — ABNORMAL HIGH (ref 70–99)
Potassium: 4 mmol/L (ref 3.5–5.1)
Sodium: 139 mmol/L (ref 135–145)

## 2021-05-13 MED ORDER — FLUCONAZOLE 200 MG PO TABS: 200.0000 mg | ORAL_TABLET | Freq: Every day | ORAL | 0 refills | Status: DC

## 2021-05-13 NOTE — Discharge Instructions (Addendum)
Please take medications as prescribed We will call you with recommendations if labs are abnormal If your symptoms worsen please return to urgent care to be reevaluated.

## 2021-05-13 NOTE — ED Provider Notes (Signed)
[facility]    CSN: [id number] Arrival date & time: 05/13/21  1731      History   Chief Complaint Chief Complaint  Patient presents with   Rash    HPI Leonard Sweeney is a 50 y.o. male comes to urgent care with 1 month history of itchy facial rash.  Symptoms started a month ago and has been progressively worse.  Initially started in the paranasal areas and then has spread to involve the beard.  Mild redness over the areas.  No fever or chills.  No vesicles or open wounds.  Patient denies rashes on any parts of the body.  No joint pains or aches.  No joint swelling.  No history of autoimmune disease.  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 No pertinent surgical history.     Home Medications    Prior to Admission medications   Medication Sig Start Date End Date Taking? Authorizing Provider  fluconazole (DIFLUCAN) 200 MG tablet Take 1 tablet (200 mg total) by mouth daily for 10 days. 05/13/21 05/23/21 Yes Hazleigh Mccleave, Britta Mccreedy, MD  acetaminophen (TYLENOL) 325 MG tablet Take 650 mg by mouth every 6 (six) hours as needed for moderate pain.    [provider]  benzonatate (TESSALON) 100 MG capsule Take 1 capsule (100 mg total) by mouth every 8 (eight) hours. 11/27/19   Mannie Stabile, PA-C  gabapentin (NEURONTIN) 300 MG capsule Take 1 capsule (300 mg total) by mouth 2 (two) times daily. Patient not taking: Reported on 11/29/2019 10/01/19   Lenard Lance, FNP  ibuprofen (ADVIL) 800 MG tablet Take 1 tablet (800 mg total) by mouth 3 (three) times daily. 11/27/19   Mannie Stabile, PA-C  nabumetone (RELAFEN) 750 MG tablet Take 1 tablet (750 mg total) by mouth 2 (two) times daily as needed. Patient not taking: Reported on 09/30/2019 07/13/19   Kathryne Hitch, MD   ondansetron (ZOFRAN ODT) 4 MG disintegrating tablet Take 1 tablet (4 mg total) by mouth every 8 (eight) hours as needed for nausea or vomiting. 11/27/19   Mannie Stabile, PA-C    Family History History reviewed. No pertinent family history.  Social History Social History   Tobacco Use   Smoking status: Never   Smokeless tobacco: Never  Vaping Use   Vaping Use: Never used  Substance Use Topics   Alcohol use: Not Currently    Comment: quart of beer several times a week   Drug use: Not Currently     Allergies   Patient has no known allergies.   Review of Systems Review of Systems  Musculoskeletal: Negative.   Skin:  Positive for rash. Negative for color change and wound.  All other systems reviewed and are negative.   Physical Exam Triage Vital Signs ED Triage Vitals  Enc Vitals Group     BP 05/13/21 1831 (!) 168/80     Pulse Rate 05/13/21 1831 82     Resp 05/13/21 1831 18     Temp 05/13/21 1831 98.1 F (36.7 C)     Temp src --      SpO2 05/13/21 1831 98 %     Weight --      Height --      Head Circumference --  Peak Flow --      Pain Score 05/13/21 1828 0     Pain Loc --      Pain Edu? --      Excl. in GC? --    No data found.  Updated Vital Signs BP (!) 168/80   Pulse 82   Temp 98.1 F (36.7 C)   Resp 18   SpO2 98%   Visual Acuity Right Eye Distance:   Left Eye Distance:   Bilateral Distance:    Right Eye Near:   Left Eye Near:    Bilateral Near:     Physical Exam Vitals and nursing note reviewed.  Constitutional:      General: He is not in acute distress.    Appearance: Normal appearance. He is not ill-appearing.  Cardiovascular:     Rate and Rhythm: Normal rate and regular rhythm.  Musculoskeletal:        General: Normal range of motion.  Skin:    General: Skin is warm.     Findings: Rash present. No erythema or lesion.     Comments: Facial rash over the left side of the face.  No hyper or hypopigmentation.  Some scaling of  the skin.  Neurological:     Mental Status: He is alert.     UC Treatments / Results  Labs (all labs ordered are listed, but only abnormal results are displayed) Labs Reviewed  CBC  BASIC METABOLIC PANEL  ANTINUCLEAR ANTIBODIES, IFA    EKG   Radiology No results found.  Procedures Procedures (including critical care time)  Medications Ordered in UC Medications - No data to display  Initial Impression / Assessment and Plan / UC Course  I have reviewed the triage vital signs and the nursing notes.  Pertinent labs & imaging results that were available during my care of the patient were reviewed by me and considered in my medical decision making (see chart for details).     1.  Facial rash: This is likely Tinea barbae.  I will also order some basic labs to evaluate for SLE or other autoimmune disease. Fluconazole 200 mg orally daily for 10 days Patient is advised to return to urgent care if symptoms worsen. Final Clinical Impressions(s) / UC Diagnoses   Final diagnoses:  Facial rash     Discharge Instructions      Please take medications as prescribed We will call you with recommendations if labs are abnormal If your symptoms worsen please return to urgent care to be reevaluated.   ED Prescriptions     Medication Sig Dispense Auth. Provider   fluconazole (DIFLUCAN) 200 MG tablet Take 1 tablet (200 mg total) by mouth daily for 10 days. 10 tablet Leonard Sweeney, Britta Mccreedy, MD      PDMP not reviewed this encounter.   Merrilee Jansky, MD 05/13/21 [phone]

## 2021-05-13 NOTE — ED Triage Notes (Signed)
Pt reports he ha had a resh to face for one month.

## 2021-05-14 ENCOUNTER — Telehealth (HOSPITAL_COMMUNITY): Payer: Self-pay

## 2021-05-14 MED ORDER — FLUCONAZOLE 200 MG PO TABS: 200.0000 mg | ORAL_TABLET | Freq: Every day | ORAL | 0 refills | Status: AC

## 2021-05-24 LAB — ANTINUCLEAR ANTIBODIES, IFA: ANA Ab, IFA: NEGATIVE

## 2021-08-06 ENCOUNTER — Ambulatory Visit (HOSPITAL_COMMUNITY)
Admission: EM | Admit: 2021-08-06 | Discharge: 2021-08-07 | Disposition: A | Discharge status: Home / Self Care | Payer: No Payment, Other | Attending: Nurse Practitioner | Admitting: Nurse Practitioner

## 2021-08-06 DIAGNOSIS — Z5900 Homelessness unspecified: Secondary | ICD-10-CM | POA: Insufficient documentation

## 2021-08-06 DIAGNOSIS — F1414 Cocaine abuse with cocaine-induced mood disorder: Secondary | ICD-10-CM | POA: Insufficient documentation

## 2021-08-06 DIAGNOSIS — Z20822 Contact with and (suspected) exposure to covid-19: Secondary | ICD-10-CM | POA: Insufficient documentation

## 2021-08-06 DIAGNOSIS — R45851 Suicidal ideations: Secondary | ICD-10-CM | POA: Insufficient documentation

## 2021-08-06 DIAGNOSIS — F1994 Other psychoactive substance use, unspecified with psychoactive substance-induced mood disorder: Secondary | ICD-10-CM

## 2021-08-06 DIAGNOSIS — F1494 Cocaine use, unspecified with cocaine-induced mood disorder: Secondary | ICD-10-CM

## 2021-08-07 ENCOUNTER — Other Ambulatory Visit: Payer: Self-pay

## 2021-08-07 DIAGNOSIS — Z5900 Homelessness unspecified: Secondary | ICD-10-CM | POA: Diagnosis not present

## 2021-08-07 DIAGNOSIS — F1414 Cocaine abuse with cocaine-induced mood disorder: Secondary | ICD-10-CM | POA: Diagnosis not present

## 2021-08-07 DIAGNOSIS — R45851 Suicidal ideations: Secondary | ICD-10-CM | POA: Diagnosis not present

## 2021-08-07 DIAGNOSIS — Z20822 Contact with and (suspected) exposure to covid-19: Secondary | ICD-10-CM | POA: Diagnosis not present

## 2021-08-07 LAB — LIPID PANEL
Cholesterol: 213 mg/dL — ABNORMAL HIGH (ref 0–200)
HDL: 52 mg/dL (ref >40)
LDL Cholesterol: 146 mg/dL — ABNORMAL HIGH (ref 0–99)
Total CHOL/HDL Ratio: 4.1 RATIO
Triglycerides: 74 mg/dL (ref <150)
VLDL: 15 mg/dL (ref 0–40)

## 2021-08-07 LAB — COMPREHENSIVE METABOLIC PANEL
ALT: 36 U/L (ref 0–44)
AST: 37 U/L (ref 15–41)
Albumin: 3.9 g/dL (ref 3.5–5.0)
Alkaline Phosphatase: 70 U/L (ref 38–126)
Anion gap: 11 (ref 5–15)
BUN: 14 mg/dL (ref 6–20)
CO2: 28 mmol/L (ref 22–32)
Calcium: 9.5 mg/dL (ref 8.9–10.3)
Chloride: 101 mmol/L (ref 98–111)
Creatinine, Ser: 1.31 mg/dL — ABNORMAL HIGH (ref 0.61–1.24)
GFR, Estimated: >60 mL/min (ref >60)
Glucose, Bld: 97 mg/dL (ref 70–99)
Potassium: 3.9 mmol/L (ref 3.5–5.1)
Sodium: 140 mmol/L (ref 135–145)
Total Bilirubin: 0.9 mg/dL (ref 0.3–1.2)
Total Protein: 7.4 g/dL (ref 6.5–8.1)

## 2021-08-07 LAB — CBC WITH DIFFERENTIAL/PLATELET
Abs Immature Granulocytes: 0.04 10*3/uL (ref 0.00–0.07)
Basophils Absolute: 0 10*3/uL (ref 0.0–0.1)
Basophils Relative: 0 %
Eosinophils Absolute: 0 10*3/uL (ref 0.0–0.5)
Eosinophils Relative: 0 %
HCT: 36.2 % — ABNORMAL LOW (ref 39.0–52.0)
Hemoglobin: 12.1 g/dL — ABNORMAL LOW (ref 13.0–17.0)
Immature Granulocytes: 1 %
Lymphocytes Relative: 19 %
Lymphs Abs: 1.6 10*3/uL (ref 0.7–4.0)
MCH: 31.1 pg (ref 26.0–34.0)
MCHC: 33.4 g/dL (ref 30.0–36.0)
MCV: 93.1 fL (ref 80.0–100.0)
Monocytes Absolute: 0.6 10*3/uL (ref 0.1–1.0)
Monocytes Relative: 7 %
Neutro Abs: 6.2 10*3/uL (ref 1.7–7.7)
Neutrophils Relative %: 73 %
Platelets: 247 10*3/uL (ref 150–400)
RBC: 3.89 MIL/uL — ABNORMAL LOW (ref 4.22–5.81)
RDW: 13.8 % (ref 11.5–15.5)
WBC: 8.5 10*3/uL (ref 4.0–10.5)
nRBC: 0 % (ref 0.0–0.2)

## 2021-08-07 LAB — POCT URINE DRUG SCREEN - MANUAL ENTRY (I-SCREEN)
POC Amphetamine UR: NOT DETECTED
POC Buprenorphine (BUP): NOT DETECTED
POC Cocaine UR: POSITIVE — AB
POC Marijuana UR: NOT DETECTED
POC Methadone UR: NOT DETECTED
POC Methamphetamine UR: NOT DETECTED
POC Morphine: NOT DETECTED
POC Oxazepam (BZO): NOT DETECTED
POC Oxycodone UR: NOT DETECTED
POC Secobarbital (BAR): NOT DETECTED

## 2021-08-07 LAB — RESP PANEL BY RT-PCR (FLU A&B, COVID) ARPGX2
Influenza A by PCR: NEGATIVE
Influenza B by PCR: NEGATIVE
SARS Coronavirus 2 by RT PCR: NEGATIVE

## 2021-08-07 LAB — HEMOGLOBIN A1C
Hgb A1c MFr Bld: 6.5 % — ABNORMAL HIGH (ref 4.8–5.6)
Mean Plasma Glucose: 139.85 mg/dL

## 2021-08-07 LAB — POC SARS CORONAVIRUS 2 AG: SARSCOV2ONAVIRUS 2 AG: NEGATIVE

## 2021-08-07 LAB — TSH: TSH: 2.386 u[IU]/mL (ref 0.350–4.500)

## 2021-08-07 LAB — ETHANOL: Alcohol, Ethyl (B): <10 mg/dL (ref <10)

## 2021-08-07 LAB — POC SARS CORONAVIRUS 2 AG -  ED: SARS Coronavirus 2 Ag: NEGATIVE

## 2021-08-07 MED ORDER — HYDROXYZINE HCL 25 MG PO TABS
25.0000 mg | ORAL_TABLET | Freq: Three times a day (TID) | ORAL | Status: DC | PRN
  Administered 2021-08-07: 25 mg via ORAL
  Filled 2021-08-07: qty 1

## 2021-08-07 MED ORDER — MAGNESIUM HYDROXIDE 400 MG/5ML PO SUSP: 30.0000 mL | Freq: Every day | ORAL | Status: DC | PRN

## 2021-08-07 MED ORDER — ALUM & MAG HYDROXIDE-SIMETH 200-200-20 MG/5ML PO SUSP: 30.0000 mL | ORAL | Status: DC | PRN

## 2021-08-07 MED ORDER — TRAZODONE HCL 50 MG PO TABS
50.0000 mg | ORAL_TABLET | Freq: Every evening | ORAL | Status: DC | PRN
  Administered 2021-08-07: 50 mg via ORAL
  Filled 2021-08-07: qty 1

## 2021-08-07 MED ORDER — ACETAMINOPHEN 325 MG PO TABS: 650.0000 mg | ORAL_TABLET | Freq: Four times a day (QID) | ORAL | Status: DC | PRN

## 2021-08-07 NOTE — ED Notes (Signed)
Pt sleeping@this time. Breathing even and unlabored. Will continue to monitor for safety 

## 2021-08-07 NOTE — ED Notes (Signed)
Received patient this AM. Patient in his bed asleep. Patient feels concerned. Patient states he is concerned about being homeless. Patient verbalized ...denies wanting to harm self. Patient says his plan to deal with life is to stop using cocaine.

## 2021-08-07 NOTE — ED Notes (Signed)
Pt A&O x 4, presents with suicidal ideations, plan to step in front of car  Pt experiencing violent thoughts and hearing voices, seeing demons and spirits and hearing negative voices.  Comfort measures given, snack given.  Monitoring for safety.

## 2021-08-07 NOTE — ED Notes (Signed)
Lunch given.

## 2021-08-07 NOTE — ED Provider Notes (Signed)
Behavioral Health Admission [facility] & OBS)  Date: 08/07/21 Patient Name: Leonard Sweeney MRN: [id number] Chief Complaint: No chief complaint on file.     Diagnoses:  Final diagnoses:  Substance induced mood disorder (HCC)    HPI: Leonard Sweeney is a 50 y.o. male with a history of cocaine use and cocaine induced mood disorder who presents to [facility] voluntarily due to SI. Patient reports feeling suicidal with plans to walk into traffic. Patient reports that he is currently homeless. Patient states that he hears voices that make negative statements such as, "I'm gonna run into a truck." Patient denies that the voices tell him to harm himself. He states that it is like the voices are predicting the future. He reports that he has visions of demons. He states that he occasionally uses cocaine. States that last use was Friday. Denies use of other substances. UDS positive for cocaine, otherwise negative.   Patient denies a history of inpatient psychiatric treatment. Denies taking psychotropics and other medications. Patient was admitted to OBS unit at [facility] in 09/2019 with similar presentation.     PHQ 2-9:   Flowsheet Row ED from 08/06/2021 in [facility] ED from 05/13/2021 in [facility] Admission (Discharged) from 09/30/2019 in BEHAVIORAL HEALTH OBSERVATION UNIT  C-SSRS RISK CATEGORY High Risk No Risk High Risk        Total Time spent with patient: 20 minutes  Musculoskeletal  Strength & Muscle Tone: within normal limits Gait & Station: normal Patient leans: N/A  Psychiatric Specialty Exam  Presentation General Appearance: Appropriate for Environment; Fairly Groomed  Eye Contact:Minimal  Speech:Clear and Coherent; Normal Rate  Speech Volume:Decreased  Handedness:Right   Mood and Affect  Mood:Anxious; Depressed; Hopeless  Affect:Congruent   Thought Process  Thought Processes:Coherent  Descriptions of  Associations:Intact  Orientation:Full (Time, Place and Person)  Thought Content:Logical    Hallucinations:Hallucinations: Auditory  Ideas of Reference:Paranoia  Suicidal Thoughts:Suicidal Thoughts: Yes, Active SI Active Intent and/or Plan: With Intent; With Plan; With Means to Carry Out  Homicidal Thoughts:Homicidal Thoughts: No   Sensorium  Memory:Immediate Good; Recent Good; Remote Good  Judgment:Intact  Insight:Present   Executive Functions  Concentration:Good  Attention Span:Good  Recall:Good  Fund of Knowledge:Good  Language:Good   Psychomotor Activity  Psychomotor Activity:Psychomotor Activity: Normal   Assets  Assets:Communication Skills; Desire for Improvement   Sleep  Sleep:Sleep: Fair   Nutritional Assessment (For OBS and [facility]BC admissions only) Has the patient had a weight loss or gain of 10 pounds or more in the last 3 months?: No Has the patient had a decrease in food intake/or appetite?: No Does the patient have dental problems?: No Does the patient have eating habits or behaviors that may be indicators of an eating disorder including binging or inducing vomiting?: No Has the patient recently lost weight without trying?: 0 Has the patient been eating poorly because of a decreased appetite?: 0 Malnutrition Screening Tool Score: 0   Physical Exam Constitutional:      General: He is not in acute distress.    Appearance: He is not ill-appearing, toxic-appearing or diaphoretic.  HENT:     Head: Normocephalic.     Right Ear: External ear normal.     Left Ear: External ear normal.  Eyes:     Pupils: Pupils are equal, round, and reactive to light.  Cardiovascular:     Rate and Rhythm: Normal rate.  Pulmonary:     Effort: Pulmonary effort is normal. No  respiratory distress.  Musculoskeletal:        General: Normal range of motion.  Skin:    General: Skin is warm and dry.  Neurological:     Mental Status: He is alert and oriented to  person, place, and time.  Psychiatric:        Mood and Affect: Mood is anxious and depressed.        Speech: Speech normal.        Behavior: Behavior is cooperative.        Thought Content: Thought content is paranoid. Thought content includes suicidal ideation. Thought content does not include homicidal ideation. Thought content includes suicidal plan.   Review of Systems  Constitutional:  Negative for chills, diaphoresis, fever, malaise/fatigue and weight loss.  HENT:  Negative for congestion.   Respiratory:  Negative for cough and shortness of breath.   Cardiovascular:  Negative for chest pain and palpitations.  Gastrointestinal:  Negative for diarrhea, nausea and vomiting.  Neurological:  Negative for dizziness and seizures.  Psychiatric/Behavioral:  Positive for depression, hallucinations, substance abuse and suicidal ideas. Negative for memory loss. The patient is nervous/anxious and has insomnia.   All other systems reviewed and are negative.  Blood pressure (!) 165/83, pulse 91, temperature 98 F (36.7 C), temperature source Oral, resp. rate 20, SpO2 97 %. There is no height or weight on file to calculate BMI.  Past Psychiatric History: See above  Is the patient at risk to self? Yes  Has the patient been a risk to self in the past 6 months? No .    Has the patient been a risk to self within the distant past? Yes   Is the patient a risk to others? No   Has the patient been a risk to others in the past 6 months? No   Has the patient been a risk to others within the distant past? No   Past Medical History:  Past Medical History:  Diagnosis Date   Anxiety    Depression    No past surgical history on file.  Family History: No family history on file.  Social History:  Social History   Socioeconomic History   Marital status: Single    Spouse name: Not on file   Number of children: Not on file   Years of education: Not on file   Highest education level: 12th grade   Occupational History   Not on file  Tobacco Use   Smoking status: Never   Smokeless tobacco: Never  Vaping Use   Vaping Use: Never used  Substance and Sexual Activity   Alcohol use: Not Currently    Comment: quart of beer several times a week   Drug use: Not Currently   Sexual activity: Not Currently  Other Topics Concern   Not on file  Social History Narrative   Not on file   Social Determinants of Health   Financial Resource Strain: Not on file  Food Insecurity: Not on file  Transportation Needs: Not on file  Physical Activity: Not on file  Stress: Not on file  Social Connections: Not on file  Intimate Partner Violence: Not on file    SDOH:  SDOH Screenings   Alcohol Screen: Not on file  Depression (IOE7-0): Not on file  Financial Resource Strain: Not on file  Food Insecurity: Not on file  Housing: Not on file  Physical Activity: Not on file  Social Connections: Not on file  Stress: Not on file  Tobacco Use:  Low Risk    Smoking Tobacco Use: Never   Smokeless Tobacco Use: Never   Passive Exposure: Not on file  Transportation Needs: Not on file    Last Labs:  Admission on 08/06/2021  Component Date Value Ref Range Status   POC Amphetamine UR 08/07/2021 None Detected  NONE DETECTED (Cut Off Level 1000 ng/mL) Preliminary   POC Secobarbital (BAR) 08/07/2021 None Detected  NONE DETECTED (Cut Off Level 300 ng/mL) Preliminary   POC Buprenorphine (BUP) 08/07/2021 None Detected  NONE DETECTED (Cut Off Level 10 ng/mL) Preliminary   POC Oxazepam (BZO) 08/07/2021 None Detected  NONE DETECTED (Cut Off Level 300 ng/mL) Preliminary   POC Cocaine UR 08/07/2021 Positive (A)  NONE DETECTED (Cut Off Level 300 ng/mL) Preliminary   POC Methamphetamine UR 08/07/2021 None Detected  NONE DETECTED (Cut Off Level 1000 ng/mL) Preliminary   POC Morphine 08/07/2021 None Detected  NONE DETECTED (Cut Off Level 300 ng/mL) Preliminary   POC Oxycodone UR 08/07/2021 None Detected  NONE  DETECTED (Cut Off Level 100 ng/mL) Preliminary   POC Methadone UR 08/07/2021 None Detected  NONE DETECTED (Cut Off Level 300 ng/mL) Preliminary   POC Marijuana UR 08/07/2021 None Detected  NONE DETECTED (Cut Off Level 50 ng/mL) Preliminary  Admission on 05/13/2021, Discharged on 05/13/2021  Component Date Value Ref Range Status   WBC 05/13/2021 6.2  4.0 - 10.5 K/uL Final   RBC 05/13/2021 3.93 (A)  4.22 - 5.81 MIL/uL Final   Hemoglobin 05/13/2021 12.1 (A)  13.0 - 17.0 g/dL Final   HCT 02/63/7858 35.8 (A)  39.0 - 52.0 % Final   MCV 05/13/2021 91.1  80.0 - 100.0 fL Final   MCH 05/13/2021 30.8  26.0 - 34.0 pg Final   MCHC 05/13/2021 33.8  30.0 - 36.0 g/dL Final   RDW 85/10/7739 14.0  11.5 - 15.5 % Final   Platelets 05/13/2021 228  150 - 400 K/uL Final   nRBC 05/13/2021 0.0  0.0 - 0.2 % Final   Performed at [facility] [place] [facility], [address], [place], [place] [address]   Sodium 05/13/2021 139  135 - 145 mmol/L Final   Potassium 05/13/2021 4.0  3.5 - 5.1 mmol/L Final   HEMOLYSIS AT THIS LEVEL MAY AFFECT RESULT   Chloride 05/13/2021 106  98 - 111 mmol/L Final   CO2 05/13/2021 26  22 - 32 mmol/L Final   Glucose, Bld 05/13/2021 123 (A)  70 - 99 mg/dL Final   Glucose reference range applies only to samples taken after fasting for at least 8 hours.   BUN 05/13/2021 10  6 - 20 mg/dL Final   Creatinine, Ser 05/13/2021 1.08  0.61 - 1.24 mg/dL Final   Calcium 76/72/0947 9.2  8.9 - 10.3 mg/dL Final   GFR, Estimated 05/13/2021 >60  >60 mL/min Final   Comment: (NOTE) Calculated using the CKD-EPI Creatinine Equation (2021)    Anion gap 05/13/2021 7  5 - 15 Final   Performed at [facility], [address], [place], [place] [address]   ANA Ab, IFA 05/13/2021 Negative   Final   Comment: (NOTE)                                     Negative   <1:80  Borderline  1:80                                     Positive   >1:80 ICAP nomenclature: AC-0 For more information  about Hep-2 cell patterns use ANApatterns.org, the official website for the International Consensus on Antinuclear Antibody (ANA) Patterns (ICAP). Performed At: [facility] [address] [place], [place] [id number] Jolene Schimke MD ZJ:[phone]     Allergies: Patient has no known allergies.  PTA Medications: (Not in a hospital admission)   Medical Decision Making  Admit to continuous assessment for crisis stabilization  Meds ordered this encounter  Medications   acetaminophen (TYLENOL) tablet 650 mg   alum & mag hydroxide-simeth (MAALOX/MYLANTA) 200-200-20 MG/5ML suspension 30 mL   magnesium hydroxide (MILK OF MAGNESIA) suspension 30 mL   hydrOXYzine (ATARAX/VISTARIL) tablet 25 mg   traZODone (DESYREL) tablet 50 mg     Lab Orders         Resp Panel by RT-PCR (Flu A&B, Covid) Nasopharyngeal Swab         CBC with Differential/Platelet         Comprehensive metabolic panel         Hemoglobin A1c         Ethanol         Lipid panel         TSH         POC SARS Coronavirus 2 Ag-ED - Nasal Swab         POCT Urine Drug Screen - (ICup)      Clinical Course as of 08/07/21 0521  Wed Aug 07, 2021  0047 POCT Urine Drug Screen - (ICup)(!) UDS positive for cocaine [JB]  0345 Lipid panel(!) Chol 213, LDL 146-recommend follow up with PCP [JB]  0345 Comprehensive metabolic panel(!) Creatinine 1.31-CMP otherwise unremarkable [JB]  0345 TSH: 2.386 [JB]  0345 Hemoglobin A1C(!): 6.5 Recommend follow up with PCP [JB]  0346 Alcohol, Ethyl (B): <10 [JB]  0346 CBC with Differential/Platelet(!) CBC consistent with previous results [JB]    Clinical Course User Index [JB] Jackelyn Poling, NP    Recommendations  Based on my evaluation the patient does not appear to have an emergency medical condition.  Jackelyn Poling, NP 08/07/21  12:45 AM

## 2021-08-07 NOTE — BH Assessment (Signed)
Comprehensive Clinical Assessment (CCA) Note  08/07/2021 Leonard Sweeney [id number]  Disposition: Nira Conn, PMHNP recommends pt to be admitted to [facility] Continuous Assessment.   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  . Acute risk factors for suicide include: unemployment and social withdrawal/isolation. Protective factors for this patient include:  None . Considering these factors, the overall suicide risk at this point appears to be high. Patient is appropriate for outpatient follow up.  Leonard Sweeney is a 50 year old male who presents voluntary and unaccompanied to [facility]. Clinician asked the pt, "what brought you to the hospital?" Pt reports, "something taking over my mind-having a lot of violent thoughts, thinking something bad is going to happen towards me and others." Pt reports, within the past month he lad a plan of stepping out in front of a car but is currently having passive suicidal ideations. Pt reports, hearing voices "I'm gonna run into a truck," he hears a lot of negative, violent voices during the day while seeing spirits and demons simultaneously. 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 Pt denies, HI, self-injurious behaviors and access to weapons.   Pt denies, substance use to clinician but reports using Cocaine to PMHNP. Pt's UDS is pending. Pt denies, being linked to OPT resources (medication management and/or   counseling.) Pt denies, previous inpatient admissions.   Pt presents quiet, awake with his eyes closed and normal speech. Pt's mood was depressed. Pt's affect was flat. Pr's insight, judgement was fair. Pt reports, if discharged he can not contract for safety.   Diagnosis: Substance Induced Mood Disorder.  *Pt denies, having family/friend supports.*  Chief Complaint: No chief complaint on file.  Visit Diagnosis:     CCA Screening, Triage and Referral (STR)  Patient Reported Information How did you hear about [place]? Legal System  What Is the Reason for Your Visit/Call Today? Pt reports, having suicidal thoughts for the past 8-9 months. Per pt, within last month he had a plan of stepping in front of a car however pt is currently having passive suicidal ideations.  Pt reports, something is taking over his mind, having a lot of violent thoughts, thinking something bad is going to happen. Pt reports, hearing voices of something bad is going to happen "I'm gonna run into a truck." Pt also reports, seeing demons and spirits while hearing negative voices.  How Long Has This Been Causing You Problems? 1-6 months  What Do You Feel Would Help You the Most Today? Treatment for Depression or other mood problem   Have You Recently Had Any Thoughts About Hurting Yourself? Yes  Are You Planning to Commit Suicide/Harm Yourself At This time? No   Have you Recently Had Thoughts About Hurting Someone Karolee Ohs? No  Are You Planning to Harm Someone at This Time? No  Explanation: No data recorded  Have You Used Any Alcohol or Drugs in the Past 24 Hours? No (Pt denies.)  How Long Ago Did You Use  Drugs or Alcohol? No data recorded What Did You Use and How Much? No data recorded  Do You Currently Have a Therapist/Psychiatrist? No  Name of Therapist/Psychiatrist: No data recorded  Have You Been Recently Discharged From Any Office Practice or Programs? No data recorded Explanation of Discharge From  Practice/Program: No data recorded    CCA Screening Triage Referral Assessment Type of Contact: Face-to-Face  Telemedicine Service Delivery:   Is this Initial or Reassessment? No data recorded Date Telepsych consult ordered in [facility]:  No data recorded Time Telepsych consult ordered in CH[facility]:  No data recorded Location of Assessment: [facility] Assessment Services  Provider Location: [facility] Assessment Services   Collateral Involvement: Pt denies, having family/friend supports.   Does Patient Have a Stage manager Guardian? No data recorded Name and Contact of Legal Guardian: No data recorded If Minor and Not Living with Parent(s), Who has Custody? No data recorded Is CPS involved or ever been involved? No data recorded Is APS involved or ever been involved? No data recorded  Patient Determined To Be At Risk for Harm To Self or Others Based on Review of Patient Reported Information or Presenting Complaint? Yes, for Self-Harm  Method: No data recorded Availability of Means: No data recorded Intent: No data recorded Notification Required: No data recorded Additional Information for Danger to Others Potential: No data recorded Additional Comments for Danger to Others Potential: No data recorded Are There Guns or Other Weapons in Your Home? No data recorded Types of Guns/Weapons: No data recorded Are These Weapons Safely Secured?                            No data recorded Who Could Verify You Are Able To Have These Secured: No data recorded Do You Have any Outstanding Charges, Pending Court Dates, Parole/Probation? No data recorded Contacted To Inform of Risk of Harm To Self or Others: No data recorded   Does Patient Present under Involuntary Commitment? No  IVC Papers Initial File Date: No data recorded  [place] of Residence: [place]   Patient Currently Receiving the Following Services: Not Receiving Services   Determination of Need: Urgent (48 hours)   Options For  Referral: Facility-Based Crisis; [facility] Urgent Care; Medication Management; Outpatient Therapy     CCA Biopsychosocial Patient Reported Schizophrenia/Schizoaffective Diagnosis in Past: No data recorded  Strengths: No data recorded  Mental Health Symptoms Depression:   Sleep (too much or little); Tearfulness; Hopelessness; Irritability; Fatigue; Difficulty Concentrating; Worthlessness (Isolation.)   Duration of Depressive symptoms:  Duration of Depressive Symptoms: Greater than two weeks   Mania:  No data recorded  Anxiety:    Worrying; Tension   Psychosis:   Hallucinations   Duration of Psychotic symptoms:    Trauma:   None   Obsessions:  No data recorded  Compulsions:  No data recorded  Inattention:   Forgetful   Hyperactivity/Impulsivity:   Feeling of restlessness; Fidgets with hands/feet   Oppositional/Defiant Behaviors:   Argumentative   Emotional Irregularity:   Recurrent suicidal behaviors/gestures/threats   Other Mood/Personality Symptoms:  No data recorded   Mental Status Exam Appearance and self-care  Stature:   Average   Weight:  No data recorded  Clothing:   Casual   Grooming:   Normal   Cosmetic use:   None   Posture/gait:   Normal   Motor activity:   Not Remarkable   Sensorium  Attention:   Normal   Concentration:  Normal   Orientation:   X5   Recall/memory:   Normal   Affect and Mood  Affect:   Flat   Mood:  No data recorded  Relating  Eye contact:   Normal   Facial expression:   Depressed   Attitude toward examiner:   Cooperative   Thought and Language  Speech flow:  Normal   Thought content:   Appropriate to Mood and Circumstances   Preoccupation:   None   Hallucinations:   Auditory; Visual   Organization:  No data recorded  [employer] of Knowledge:   Fair   Intelligence:  No data recorded  Abstraction:  No data recorded  Judgement:   Fair   Reality Testing:  No data  recorded  Insight:   Fair   Decision Making:   Impulsive   Social Functioning  Social Maturity:   Impulsive   Social Judgement:  No data recorded  Stress  Stressors:   Other (Comment); Housing (Per pt, getting through the day.)   Coping Ability:   Deficient supports; Overwhelmed   Skill Deficits:   Communication; Self-control   Supports:   Support needed     Religion: Religion/Spirituality Are You A Religious Person?: Yes What is Your Religious Affiliation?: International aid/development worker: Leisure / Recreation Do You Have Hobbies?: No  Exercise/Diet: Exercise/Diet Do You Exercise?: No Do You Follow a Special Diet?: No Do You Have Any Trouble Sleeping?: Yes Explanation of Sleeping Difficulties: Pt reports, getting 5-6 hours of sleep.   CCA Employment/Education Employment/Work Situation: Employment / Work Situation Employment Situation: Unemployed Has Patient ever Been in Passenger transport manager?: No  Education: Education Is Patient Currently Attending School?: No Last Grade Completed:  (GED.) Did Physicist, medical?: No   CCA Family/Childhood History Family and Relationship History:    Childhood History:     Child/Adolescent Assessment:     CCA Substance Use Alcohol/Drug Use: Alcohol / Drug Use Pain Medications: See MAR Prescriptions: See MAR Over the Counter: See MAR History of alcohol / drug use?: No history of alcohol / drug abuse     ASAM's:  Six Dimensions of Multidimensional Assessment  Dimension 1:  Acute Intoxication and/or Withdrawal Potential:      Dimension 2:  Biomedical Conditions and Complications:      Dimension 3:  Emotional, Behavioral, or Cognitive Conditions and Complications:     Dimension 4:  Readiness to Change:     Dimension 5:  Relapse, Continued use, or Continued Problem Potential:     Dimension 6:  Recovery/Living Environment:     ASAM Severity Score:    ASAM Recommended Level of Treatment:     Substance use  Disorder (SUD)    Recommendations for Services/Supports/Treatments: Recommendations for Services/Supports/Treatments Recommendations For Services/Supports/Treatments: Other (Comment) ([facility] Continuous Assessment.)  Discharge Disposition:    DSM5 Diagnoses: Patient Active Problem List   Diagnosis Date Noted   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  Visit Diagnosis:   Patient Reported Information How did you hear about [place]? Legal System  What Is the Reason for Your Visit/Call Today? Pt reports, having suicidal thoughts for the past 8-9 months. Per pt, within last month he had a plan of stepping in front of a car however pt is currently having passive suicidal ideations.  Pt reports, something is taking over his mind, having a lot of violent thoughts, thinking something bad is going to happen. Pt reports, hearing voices of something bad is going to happen "I'm gonna run into a truck." Pt also reports, seeing demons and spirits while hearing negative voices.  How Long Has This Been Causing You Problems? 1-6 months  What Do You Feel Would Help You the Most Today? Treatment for Depression or other mood problem   Have You Recently Had Any Thoughts About Hurting Yourself? Yes  Are You Planning to Commit Suicide/Harm Yourself At This time? No   Have you Recently Had Thoughts About [place]? No  Are You Planning to Harm Someone at This  Time? No  Explanation: No data recorded  Have You Used Any Alcohol or Drugs in the Past 24 Hours? No (Pt denies.)  How Long Ago Did You Use Drugs or Alcohol? No data recorded What Did You Use and How Much? No data recorded  Do You Currently Have a Therapist/Psychiatrist? No  Name of Therapist/Psychiatrist: No data recorded  Have You Been Recently Discharged From Any Office Practice or Programs? No data recorded Explanation of Discharge From Practice/Program: No data recorded   CCA Screening Triage Referral Assessment Type of Contact: Face-to-Face  Telemedicine Service Delivery:   Is this Initial or Reassessment? No data recorded Date Telepsych consult ordered in CHL:  No data recorded Time Telepsych consult ordered in CHL:  No data recorded Location of Assessment: [facility] Assessment Services  Provider Location: [facility] Assessment Services   Collateral Involvement: Pt denies, having family/friend supports.   Does Patient Have a Stage manager Guardian? No data recorded Name and Contact of Legal Guardian: No data recorded If Minor and Not Living with Parent(s), Who has Custody? No data recorded Is CPS involved or ever been involved? No data recorded Is APS involved or ever been involved? No data recorded  Patient Determined To Be At Risk for Harm To Self or Others Based on Review of Patient Reported Information or Presenting Complaint? Yes, for Self-Harm  Method: No data recorded Availability of Means: No data recorded Intent: No data recorded Notification Required: No data recorded Additional Information for Danger to Others Potential: No data recorded Additional Comments for Danger to Others Potential: No data recorded Are There Guns or Other Weapons in Your Home? No data recorded Types of Guns/Weapons: No data recorded Are These Weapons Safely Secured?                            No data recorded Who Could Verify You Are Able To Have These Secured: No data  recorded Do You Have any Outstanding Charges, Pending Court Dates, Parole/Probation? No data recorded Contacted To Inform of Risk of Harm To Self or Others: No data recorded  Does Patient Present under Involuntary Commitment? No  IVC Papers Initial File Date: No data recorded  [place] of Residence: [place]   Patient Currently Receiving the Following Services: Not Receiving Services   Determination of Need: Urgent (48  hours)   Options For Referral: Facility-Based Crisis; [facility] Urgent Care; Medication Management; Outpatient Therapy   Discharge Disposition:     Vertell Novak, [place], [place], [facility], [facility] Triage Specialist [phone]

## 2021-08-07 NOTE — Progress Notes (Signed)
   08/06/21 2351  Patient Reported Information  How Did You Hear About [place]? Legal System  What Is the Reason for Your Visit/Call Today? Pt reports, having suicidal thoughts for the past 8-9 months. Per pt, within last month he had a plan of stepping in front of a car however pt is currently having passive suicidal ideations.  Pt reports, something is taking over his mind, having a lot of violent thoughts, thinking something bad is going to happen. Pt reports, hearing voices of something bad is going to happen "I'm gonna run into a truck." Pt also reports, seeing demons and spirits while hearing negative voices.  How Long Has This Been Causing You Problems? 1-6 months  What Do You Feel Would Help You the Most Today? Treatment for Depression or other mood problem  Have You Recently Had Any Thoughts About Hurting Yourself? Yes  Are You Planning to Commit Suicide/Harm Yourself At This time? No  Have you Recently Had Thoughts About Hurting Someone Karolee Ohs? No  Are You Planning To Harm Someone At This Time? No  Have You Used Any Alcohol or Drugs in the Past 24 Hours? No (Pt denies.)  Do You Currently Have a Therapist/Psychiatrist? No  CCA Screening Triage Referral Assessment  Type of Contact Face-to-Face  Location of Assessment [facility] Assessment Services  Provider location [facility] Assessment Services  Collateral Involvement Pt denies, having family/friend supports.  Patient Determined To Be At Risk for Harm To Self or Others Based on Review of Patient Reported Information or Presenting Complaint? Yes, for Self-Harm  Does Patient Present under Involuntary Commitment? No  [place] of Residence [place]  Patient Currently Receiving the Following Services: Not Receiving Services  Determination of Need Urgent (48 hours)  Options For Referral Facility-Based Crisis;BH Urgent Care;Medication Management;Outpatient Therapy    Determination of need: Urgent.    Redmond Pulling, MS, [facility], [facility] [facility] Triage  Specialist [phone]
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
## 2021-08-07 NOTE — ED Notes (Signed)
Patient received AVS. Patient questions answered. Patient discharged with personal items. Safe Transport to take patient to [facility].

## 2021-08-07 NOTE — Discharge Instructions (Signed)

## 2021-08-12 ENCOUNTER — Telehealth (HOSPITAL_COMMUNITY): Payer: Self-pay

## 2021-08-12 NOTE — BH Assessment (Signed)
Care Management - Follow Up [facility] Discharges   Writer attempted to make contact with patient today and was unsuccessful.  Patient voicemail is not set up.

## 2021-10-11 ENCOUNTER — Other Ambulatory Visit: Payer: Self-pay

## 2021-10-11 ENCOUNTER — Encounter (HOSPITAL_COMMUNITY): Payer: Self-pay

## 2021-10-11 ENCOUNTER — Emergency Department (HOSPITAL_COMMUNITY)
Admission: EM | Admit: 2021-10-11 | Discharge: 2021-10-11 | Disposition: A | Discharge status: Home / Self Care | Payer: Self-pay | Attending: Emergency Medicine | Admitting: Emergency Medicine

## 2021-10-11 ENCOUNTER — Ambulatory Visit (HOSPITAL_COMMUNITY)
Admission: EM | Admit: 2021-10-11 | Discharge: 2021-10-11 | Disposition: A | Discharge status: Home / Self Care | Payer: No Payment, Other | Attending: Psychiatry | Admitting: Psychiatry

## 2021-10-11 DIAGNOSIS — F1410 Cocaine abuse, uncomplicated: Secondary | ICD-10-CM | POA: Insufficient documentation

## 2021-10-11 DIAGNOSIS — R45851 Suicidal ideations: Secondary | ICD-10-CM | POA: Insufficient documentation

## 2021-10-11 DIAGNOSIS — F1490 Cocaine use, unspecified, uncomplicated: Secondary | ICD-10-CM

## 2021-10-11 DIAGNOSIS — Z046 Encounter for general psychiatric examination, requested by authority: Secondary | ICD-10-CM | POA: Insufficient documentation

## 2021-10-11 DIAGNOSIS — F1414 Cocaine abuse with cocaine-induced mood disorder: Secondary | ICD-10-CM | POA: Insufficient documentation

## 2021-10-11 DIAGNOSIS — Z20822 Contact with and (suspected) exposure to covid-19: Secondary | ICD-10-CM | POA: Insufficient documentation

## 2021-10-11 LAB — CBC
HCT: 38.9 % — ABNORMAL LOW (ref 39.0–52.0)
Hemoglobin: 12.8 g/dL — ABNORMAL LOW (ref 13.0–17.0)
MCH: 30.3 pg (ref 26.0–34.0)
MCHC: 32.9 g/dL (ref 30.0–36.0)
MCV: 92 fL (ref 80.0–100.0)
Platelets: 258 10*3/uL (ref 150–400)
RBC: 4.23 MIL/uL (ref 4.22–5.81)
RDW: 14.8 % (ref 11.5–15.5)
WBC: 9.3 10*3/uL (ref 4.0–10.5)
nRBC: 0 % (ref 0.0–0.2)

## 2021-10-11 LAB — RESP PANEL BY RT-PCR (FLU A&B, COVID) ARPGX2
Influenza A by PCR: NEGATIVE
Influenza B by PCR: NEGATIVE
SARS Coronavirus 2 by RT PCR: NEGATIVE

## 2021-10-11 LAB — COMPREHENSIVE METABOLIC PANEL
ALT: 30 U/L (ref 0–44)
AST: 38 U/L (ref 15–41)
Albumin: 4.2 g/dL (ref 3.5–5.0)
Alkaline Phosphatase: 63 U/L (ref 38–126)
Anion gap: 8 (ref 5–15)
BUN: 24 mg/dL — ABNORMAL HIGH (ref 6–20)
CO2: 24 mmol/L (ref 22–32)
Calcium: 9.7 mg/dL (ref 8.9–10.3)
Chloride: 106 mmol/L (ref 98–111)
Creatinine, Ser: 1.14 mg/dL (ref 0.61–1.24)
GFR, Estimated: >60 mL/min (ref >60)
Glucose, Bld: 102 mg/dL — ABNORMAL HIGH (ref 70–99)
Potassium: 3.7 mmol/L (ref 3.5–5.1)
Sodium: 138 mmol/L (ref 135–145)
Total Bilirubin: 0.9 mg/dL (ref 0.3–1.2)
Total Protein: 8.4 g/dL — ABNORMAL HIGH (ref 6.5–8.1)

## 2021-10-11 LAB — ETHANOL: Alcohol, Ethyl (B): <10 mg/dL (ref <10)

## 2021-10-11 LAB — RAPID URINE DRUG SCREEN, HOSP PERFORMED
Amphetamines: NOT DETECTED
Barbiturates: NOT DETECTED
Benzodiazepines: NOT DETECTED
Cocaine: POSITIVE — AB
Opiates: NOT DETECTED
Tetrahydrocannabinol: NOT DETECTED

## 2021-10-11 LAB — ACETAMINOPHEN LEVEL: Acetaminophen (Tylenol), Serum: <10 ug/mL — ABNORMAL LOW (ref 10–30)

## 2021-10-11 LAB — SALICYLATE LEVEL: Salicylate Lvl: <7 mg/dL — ABNORMAL LOW (ref 7.0–30.0)

## 2021-10-11 NOTE — ED Provider Notes (Signed)
[facility]-EMERGENCY DEPT Provider Note   CSN: [id number] Arrival date & time: 10/11/21  1811     History  Chief Complaint  Patient presents with   Mental Health Problem   Suicidal    Leonard Sweeney is a 51 y.o. male.  51 year old male presents with complaint of auditory visual hallucinations that are telling him to go places and do things that he does not want to do.  Reports suicidal thoughts without plans.  Denies thoughts of hurting anyone else.  Reports crack cocaine last use this morning, denies alcohol use.  States that he has been on medication for this in the past however has since run out of his medication. Denies any other complaints including chest pain, abdominal pain, no other complaints or concerns.      Home Medications Prior to Admission medications   Not on File      Allergies    Patient has no known allergies.    Review of Systems   Review of Systems  Constitutional:  Negative for fever.  Respiratory:  Negative for shortness of breath.   Cardiovascular:  Negative for chest pain.  Gastrointestinal:  Negative for abdominal pain.  Musculoskeletal:  Negative for myalgias.  Skin:  Negative for rash and wound.  Allergic/Immunologic: Negative for immunocompromised state.  Neurological:  Negative for weakness.  Psychiatric/Behavioral:  Positive for hallucinations and suicidal ideas. Negative for confusion.   All other systems reviewed and are negative.  Physical Exam Updated Vital Signs BP (!) 151/86    Pulse 84    Temp 98 F (36.7 C) (Oral)    Resp 15    Ht 5\' 11"  (1.803 m)    Wt 99.7 kg    SpO2 98%    BMI 30.66 kg/m  Physical Exam Vitals and nursing note reviewed.  Constitutional:      General: He is not in acute distress.    Appearance: He is well-developed. He is not diaphoretic.  HENT:     Head: Normocephalic and atraumatic.  Eyes:     Conjunctiva/sclera: Conjunctivae normal.  Cardiovascular:     Rate and Rhythm: Normal  rate and regular rhythm.     Heart sounds: Normal heart sounds.  Pulmonary:     Effort: Pulmonary effort is normal.     Breath sounds: Normal breath sounds.  Abdominal:     Palpations: Abdomen is soft.     Tenderness: There is no abdominal tenderness.  Skin:    General: Skin is warm and dry.     Findings: No erythema or rash.  Neurological:     Mental Status: He is alert and oriented to person, place, and time.  Psychiatric:        Mood and Affect: Affect is flat.        Speech: Speech is delayed.        Behavior: Behavior is cooperative.        Thought Content: Thought content is paranoid. Thought content includes suicidal ideation. Thought content does not include homicidal ideation. Thought content does not include homicidal or suicidal plan.    ED Results / Procedures / Treatments   Labs (all labs ordered are listed, but only abnormal results are displayed) Labs Reviewed  COMPREHENSIVE METABOLIC PANEL - Abnormal; Notable for the following components:      Result Value   Glucose, Bld 102 (*)    BUN 24 (*)    Total Protein 8.4 (*)    All other components within normal limits  SALICYLATE LEVEL - Abnormal; Notable for the following components:   Salicylate Lvl <7.0 (*)    All other components within normal limits  ACETAMINOPHEN LEVEL - Abnormal; Notable for the following components:   Acetaminophen (Tylenol), Serum <10 (*)    All other components within normal limits  CBC - Abnormal; Notable for the following components:   Hemoglobin 12.8 (*)    HCT 38.9 (*)    All other components within normal limits  RAPID URINE DRUG SCREEN, HOSP PERFORMED - Abnormal; Notable for the following components:   Cocaine POSITIVE (*)    All other components within normal limits  RESP PANEL BY RT-PCR (FLU A&B, COVID) ARPGX2  ETHANOL    EKG None  Radiology No results found.  Procedures Procedures    Medications Ordered in ED Medications - No data to display  ED Course/ Medical  Decision Making/ A&P                           Medical Decision Making 51 year old male presents for behavioral health evaluation with complaint of auditory and visual hallucinations with suicidal thoughts without plan. Labs ordered for medical clearance have been reviewed and patient was medically cleared for behavioral health evaluation.  On further chart review, patient has already presented to the behavioral health urgent care tonight, was screened and discharged with outpatient resources. Case discussed with Dr. Lynelle Doctor, ER attending who has also reviewed notes.  Patient was already seen by behavioral health counselor while in the emergency room.  I discussed with counselor plan for discharge from the emergency room unless their evaluation or plan varies.  Behavioral health is in agreement with discharge from the emergency room with outpatient resources. Discussed plan of care with patient who verbalizes understanding.  No further needs at this time.  Amount and/or Complexity of Data Reviewed External Data Reviewed: labs.          Final Clinical Impression(s) / ED Diagnoses Final diagnoses:  Cocaine use    Rx / DC Orders ED Discharge Orders     None         Alden Hipp 10/11/21 2133    Mancel Bale, MD 10/11/21 2222

## 2021-10-11 NOTE — Progress Notes (Signed)
Patient is a 51 year old male that presents voluntary this date after contacting 911 in crisis. Patient states he was discharged yesterday from [facility] after using cocaine. Patient reports he had been participating in the program for the last two months and was asked to leave after relapsing on 2 grams of "crack cocaine" yesterday. Patient denies any S/I, H/I or VH although reports ongoing [place] stating "he hears bad things." Patient is a poor historian and renders a limited and conflicting history. Patient denies he has ever been seen for any psychiatric services although per chart review was seen by Zenia Resides FNP on 08/07/21 when he presented with similar symptoms and was discharged with resources not meeting inpatient criteria at that time. This date patient denies any previous mental health history. See note of 08/07/21 for additional information. Per note of 11/9 "Patient states he has been experiencing ongoing [place] . residing for the last two months.  states "I need help, I am currently homeless and I do not do well in any places like a shelter, I do not like crowds."

## 2021-10-11 NOTE — Discharge Instructions (Addendum)
Patient is instructed prior to discharge to: ? Take all medications as prescribed by his/her mental healthcare provider. ?Report any adverse effects and or reactions from the medicines to his/her outpatient provider promptly. ?Keep all scheduled appointments, to ensure that you are getting refills on time and to avoid any interruption in your medication.  If you are unable to keep an appointment call to reschedule.  Be sure to follow-up with resources and follow-up appointments provided.  ?Patient has been instructed & cautioned: To not engage in alcohol and or illegal drug use while on prescription medicines. ?In the event of worsening symptoms, patient is instructed to call the crisis hotline, 911 and or go to the nearest ED for appropriate evaluation and treatment of symptoms. ?To follow-up with his/her primary care provider for your other medical issues, concerns and or health care needs. ?  ? ?Homeless Shelter List: ? ?  ? ?[facility] ([facility]) ? ?[address] [place], [place] ? ?Phone: [phone] ? ?  ? ?[facility] ? ?[address], [place], [place] [address] ? ?Phone: [phone] ? ?  ? ?[facility] (Women only) ? ?[address], [place], [place] [address] ? ?Phone: [phone] ? ?  ? ?[facility] ? ?[address] , [place] [address] ? ?Phone: [phone] ? ?  ? ?[facility]: ? ?[address] ? ?, [place] [address] ? ?Phone: [phone] ? ?  ? ?[facility] ? ?[address], [place],  [address] ? ?(Check in at 6:00PM for placement at a local shelter) ? ?Phone: [phone] ? ?

## 2021-10-11 NOTE — Progress Notes (Signed)
CSW spoke with the patient and obtained verbal permission to speak with [facility] and [facility] with [facility].   CSW contacted the [facility] [phone] and spoke with Barbara Cower.  He reports that they are not able to accept individuals without insurance.   CSW spoke with Marcelino Duster at [facility] who reports that patient may be a good fit for the program. She requested that clinical information be faxed to [phone].  She reports that if not a direct admit from [facility] patient will need to call and schedule an appointment for possible admission.  CSW obtained patient's phone number [phone] and will include with paperwork to [facility].  Pt reports that he does not have anywhere to go and CSW informed patient that he may have to go to a shelter if not admitted.  Pt was receptive.   CSW informed patient of the above information.    Penni Homans, MSW, LCSW 10/11/2021 5:04 PM

## 2021-10-11 NOTE — ED Triage Notes (Signed)
Patient stated he was having visual and auditory hallucinations. They were telling him to go places. Patient has suicidal thoughts. But does not have a plan. No thoughts of hurting anyone else.

## 2021-10-11 NOTE — BH Assessment (Signed)
TTS Clinician descends; consoled was removed due to patient have been seen by Leonard Sweeney at [facility] earlier today.   Disposition Leonard Sweeney, recommend dc with resources.  Leonard Sweeney is a 51 years old male who presents voluntarily to [facility] and unaccompanied. Pt reported SI without a plan. Pt reports AVH, "the voices tell me to do things that I regret, go to a bar, negative thinking". Pt acknowledged guilt, compulsive and impulsive behaviors.  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 Pt reports crack cocaine is his drug of choice; also, admitted to using crack cocaine today, 10/11/20, "spent $150.00". Pt denied using any other substance.  Pt identifies his primary stressor as homeless, and using drugs, "I want to get back on my medication and find a program I can participate in". 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  Pt states he have a misdemeanor that is current legal problem. Pt denies any guns in his possession.  Pt is alert,oriented with normal speech. Thought process is relevant.  There is no indication Pt is currently responding to internal stimuli or experiencing delusional thought content.

## 2021-10-11 NOTE — Discharge Instructions (Signed)
Follow-up with outpatient and residential resource guides attached.  Go to  Hospital Urgent Care if needed.

## 2021-10-11 NOTE — ED Notes (Signed)
Patient discharge with After Visit Summary and community resources. All belongings given to patient from [facility] locker.

## 2021-10-11 NOTE — ED Provider Notes (Signed)
Behavioral Health Urgent Care Medical Screening Exam  Patient Name: Leonard Sweeney MRN: [id number] Date of Evaluation: 10/11/21 Chief Complaint:   Diagnosis:  Final diagnoses:  Cocaine use disorder ([place])    History of Present illness: Leonard Sweeney is a 51 y.o. male. Patient presents voluntarily to [facility] behavioral health for walk-in assessment.  Patient reports he contacted police earlier today from a local park.  He states "I want to find a good (substance use treatment ) program and get on the right medications."  Leonard Sweeney shares recent stressors include being discharged from sober living of [place] on yesterday after he relapsed on cocaine.  He shares he was placed on an "do not return" list after he failed to pick up peers from work.   Leonard Sweeney shares he had several months sober prior to relapse on cocaine on yesterday.  Last use of cocaine earlier today.  He denies substance use aside from cocaine over the last several months.  He remains committed to his sobriety and would like to seek residential substance use treatment.  Leonard Sweeney shares that he is not currently linked with outpatient psychiatry or therapy.  He reports no current medications.  Per chart review patient has been diagnosed with cocaine induced mood disorder.  Patient is assessed face-to-face by nurse practitioner.  He is seated in assessment area, no acute distress.  He is alert and oriented, pleasant and cooperative during assessment.   He presents with euthymic mood, congruent affect. He denies suicidal and homicidal ideations.  He denies history of suicide attempts, denies history of self-harm.  He contracts verbally for safety with this Probation officer.  He has normal speech and behavior.  He denies both auditory and visual hallucinations.  Patient is able to converse coherently with goal-directed thoughts and no distractibility or preoccupation.  He denies paranoia.  Objectively there is no evidence of psychosis/mania or  delusional thinking.  Patient most recently resided at sober living of [place] in [place].  He denies access to weapons.  He is not currently employed, typically employed in [employer].  Patient endorses average sleep and appetite.  Patient offered support and encouragement.   Psychiatric Specialty Exam  Presentation  General Appearance:Appropriate for Environment; Casual  Eye Contact:Fair  Speech:Clear and Coherent; Normal Rate  Speech Volume:Normal  Handedness:Right   Mood and Affect  Mood:Euthymic  Affect:Appropriate; Congruent   Thought Process  Thought Processes:Coherent; Goal Directed; Linear  Descriptions of Associations:Intact  Orientation:Full (Time, Place and Person)  Thought Content:Logical; WDL    Hallucinations:None  Ideas of Reference:None  Suicidal Thoughts:No With Intent; With Plan; With Means to [place]  Homicidal Thoughts:No   Sensorium  Memory:Immediate Good; Recent Good; Remote Fair  Judgment:Fair  Insight:Fair   Executive Functions  Concentration:Good  Attention Span:Good  [place]  Language:Good   Psychomotor Activity  Psychomotor Activity:Normal   Assets  Assets:Communication Skills; Desire for Improvement; Physical Health; Resilience; Social Support   Sleep  Sleep:Good  Number of hours: No data recorded  No data recorded  Physical Exam: Physical Exam Vitals and nursing note reviewed.  Constitutional:      Appearance: Normal appearance. He is well-developed.  HENT:     Head: Normocephalic and atraumatic.     Nose: Nose normal.  Cardiovascular:     Rate and Rhythm: Normal rate.  Pulmonary:     Effort: Pulmonary effort is normal.  Musculoskeletal:        General: Normal range of motion.  Cervical back: Normal range of motion.  Skin:    General: Skin is warm and dry.  Neurological:     Mental Status: He is alert and oriented to person, place, and time.   Psychiatric:        Attention and Perception: Attention and perception normal.        Mood and Affect: Mood and affect normal.        Speech: Speech normal.        Behavior: Behavior normal. Behavior is cooperative.        Thought Content: Thought content normal.        Cognition and Memory: Cognition and memory normal.        Judgment: Judgment normal.   Review of Systems  Constitutional: Negative.   HENT: Negative.    Eyes: Negative.   Respiratory: Negative.    Cardiovascular: Negative.   Gastrointestinal: Negative.   Genitourinary: Negative.   Musculoskeletal: Negative.   Skin: Negative.   Neurological: Negative.   Endo/Heme/Allergies: Negative.   Psychiatric/Behavioral:  Positive for substance abuse.   Blood pressure 139/81, pulse 86, temperature 98.1 F (36.7 C), temperature source Oral, resp. rate 16, SpO2 96 %. There is no height or weight on file to calculate BMI.  Musculoskeletal: Strength & Muscle Tone: within normal limits Gait & Station: normal Patient leans: N/A   [facility] Discharge Disposition for Follow up and Recommendations: Based on my evaluation the patient does not appear to have an emergency medical condition and can be discharged with resources and follow up care in outpatient services for Medication Management and Individual Therapy Patient reviewed with Dr. Serafina Mitchell. Follow-up with outpatient psychiatry, resources provided. Follow-up with substance use treatment resources provided.  Follow-up with shelter resources provided.   Lucky Rathke, FNP 10/11/2021, 3:59 PM

## 2021-10-22 ENCOUNTER — Telehealth (HOSPITAL_COMMUNITY): Payer: Self-pay

## 2021-10-22 NOTE — BH Assessment (Signed)
Care Management - BHUC Follow Up Discharges  ° °Writer attempted to make contact with patient today and was unsuccessful.  No voicemail ° °Per chart review, patient was provided with outpatient resources and homeless shelter resources.  ° °

## 2021-12-31 ENCOUNTER — Encounter (HOSPITAL_COMMUNITY): Payer: Self-pay

## 2021-12-31 ENCOUNTER — Ambulatory Visit (HOSPITAL_COMMUNITY)
Admission: EM | Admit: 2021-12-31 | Discharge: 2021-12-31 | Disposition: A | Discharge status: Home / Self Care | Payer: Self-pay | Attending: Family Medicine | Admitting: Family Medicine

## 2021-12-31 DIAGNOSIS — B9689 Other specified bacterial agents as the cause of diseases classified elsewhere: Secondary | ICD-10-CM

## 2021-12-31 DIAGNOSIS — N471 Phimosis: Secondary | ICD-10-CM

## 2021-12-31 DIAGNOSIS — L089 Local infection of the skin and subcutaneous tissue, unspecified: Secondary | ICD-10-CM

## 2021-12-31 LAB — CBG MONITORING, ED: Glucose-Capillary: 80 mg/dL (ref 70–99)

## 2021-12-31 MED ORDER — MUPIROCIN 2 % EX OINT: 1.0000 "application " | TOPICAL_OINTMENT | Freq: Three times a day (TID) | CUTANEOUS | 0 refills | Status: DC

## 2021-12-31 MED ORDER — LIDOCAINE HCL (PF) 1 % IJ SOLN
INTRAMUSCULAR | Status: AC
  Filled 2021-12-31: qty 2

## 2021-12-31 MED ORDER — FLUCONAZOLE 150 MG PO TABS: ORAL_TABLET | ORAL | 0 refills | Status: DC

## 2021-12-31 MED ORDER — CEFTRIAXONE SODIUM 1 G IJ SOLR
INTRAMUSCULAR | Status: AC
  Filled 2021-12-31: qty 10

## 2021-12-31 MED ORDER — CLOTRIMAZOLE 1 % EX CREA: TOPICAL_CREAM | CUTANEOUS | 0 refills | Status: DC

## 2021-12-31 MED ORDER — CEFTRIAXONE SODIUM 1 G IJ SOLR
1.0000 g | Freq: Once | INTRAMUSCULAR | Status: AC
  Administered 2021-12-31: 1 g via INTRAMUSCULAR

## 2021-12-31 MED ORDER — CEPHALEXIN 500 MG PO CAPS: 500.0000 mg | ORAL_CAPSULE | Freq: Four times a day (QID) | ORAL | 0 refills | Status: DC

## 2021-12-31 NOTE — Discharge Instructions (Addendum)
Failure to pursue adequate treatment of your current condition may lead to future complication in voiding and sexual function. The emergency department would be the best way to seek care this evening though I know you are declining this option. Therefore, begin taking the medications I have prescribed and call the urology office first thing tomorrow morning. Please proceed to the emergency department if your symptoms worsen in any way or if you develop a fever or find that you cannot void your bladder. ?

## 2021-12-31 NOTE — ED Triage Notes (Signed)
Pt reports penile discharge for several weeks.  ?Pt reports dysuria and a rash in his groin area. ?

## 2022-01-01 NOTE — ED Provider Notes (Signed)
?[name] ? ? ?[id number] ?12/31/21 Arrival Time: [id number] ? ?ASSESSMENT & PLAN: ? ?1. Phimosis of penis   ?2. Localized bacterial skin infection   ? ?Declines STI testing. Financial concerns over medications prescribed but feels he can get them this evening. ? ?Given: ?Meds ordered this encounter  ?Medications  ? cefTRIAXone (ROCEPHIN) injection 1 g  ? ?Begin: ?Meds ordered this encounter  ?Medications  ? cephALEXin (KEFLEX) 500 MG capsule  ?  Sig: Take 1 capsule (500 mg total) by mouth 4 (four) times daily.  ?  Dispense:  40 capsule  ?  Refill:  0  ? mupirocin ointment (BACTROBAN) 2 %  ?  Sig: Apply 1 application. topically 3 (three) times daily.  ?  Dispense:  22 g  ?  Refill:  0  ? clotrimazole (LOTRIMIN) 1 % cream  ?  Sig: Apply to affected area 2 times daily  ?  Dispense:  45 g  ?  Refill:  0  ? fluconazole (DIFLUCAN) 150 MG tablet  ?  Sig: Take one tablet by mouth as a single dose.  ?  Dispense:  1 tablet  ?  Refill:  0  ? ? ? ?Discharge Instructions   ? ?  ?Failure to pursue adequate treatment of your current condition may lead to future complication in voiding and sexual function. The emergency department would be the best way to seek care this evening though I know you are declining this option. Therefore, begin taking the medications I have prescribed and call the urology office first thing tomorrow morning. Please proceed to the emergency department if your symptoms worsen in any way or if you develop a fever or find that you cannot void your bladder. ? ? ? ?Labs Reviewed  ?CBG MONITORING, ED  ?80 ? ? Follow-up Information   ? ? [facility] .   ?Specialty: Emergency Medicine ?Why: If symptoms worsen in any way. ?Contact information: ?[address] ?[id number] mc ?[place] ?[phone] ? ?  ?  ? ? [facility]. Schedule an appointment as soon as possible for a visit .   ?Why: To be see ASAP. ?Contact  information: ?[address] ?[place] ?[phone] ? ?  ?  ? ?  ?  ? ?  ? ? ?Reviewed expectations re: course of current medical issues. Questions answered. ?Outlined signs and symptoms indicating need for more acute intervention. ?Patient verbalized understanding. ?After Visit Summary given. ? ? ?SUBJECTIVE: ? ?Leonard Sweeney is a 50 y.o. male who presents with complaint of penile swelling; past several weeks. With discharge. Swelling does interfere with voiding but able to void slowly. Afebrile. No trauma. Denies sexual activity over past several months. ? ? ?OBJECTIVE: ? ?Vitals:  ? 12/31/21 1841  ?BP: 138/81  ?Pulse: 94  ?Resp: 16  ?Temp: 98.1 ?F (36.7 ?C)  ?TempSrc: Oral  ?SpO2: 100%  ?  ? ?General appearance: alert, cooperative, appears stated age and no distress ?Throat: lips, mucosa, and tongue normal; teeth and gums normal ?Lungs: unlabored respirations; speaks full sentences without difficulty ?Back: no CVA tenderness; FROM at waist ?Abdomen: soft, non-tender ?GU: significant foreskin swelling with weepy drainage/discharge; scrotum with scattered pustular lesions; no abscess appreciated; normal skin over inner thighs and perineum; no inguinal LAD appreciated ?Skin: warm and dry ?Psychological: alert and cooperative; normal mood and affect. ? ?Results for orders placed or performed during the hospital encounter of 12/31/21  ?POC CBG monitoring  ?  Result Value Ref Range  ? Glucose-Capillary 80 70 - 99 mg/dL  ? ? ?Labs Reviewed  ?CBG MONITORING, ED  ? ?No Known Allergies ? ?Past Medical History:  ?Diagnosis Date  ? Anxiety   ? Depression   ? ?History reviewed. No pertinent family history. ?Social History  ? ?Socioeconomic History  ? Marital status: Single  ?  Spouse name: Not on file  ? Number of children: Not on file  ? Years of education: Not on file  ? Highest education level: 12th grade  ?Occupational History  ? Not on file  ?Tobacco Use  ? Smoking status: Never  ? Smokeless  tobacco: Never  ?Vaping Use  ? Vaping Use: Never used  ?Substance and Sexual Activity  ? Alcohol use: Not Currently  ?  Comment: quart of beer several times a week  ? Drug use: Yes  ?  Types: Cocaine  ? Sexual activity: Not Currently  ?Other Topics Concern  ? Not on file  ?Social History Narrative  ? Not on file  ? ?Social Determinants of Health  ? ?Financial Resource Strain: Not on file  ?Food Insecurity: Not on file  ?Transportation Needs: Not on file  ?Physical Activity: Not on file  ?Stress: Not on file  ?Social Connections: Not on file  ?Intimate Partner Violence: Not on file  ? ? ? ? ? ? ? ?  ?Leonard Kick, MD ?01/01/22 0945 ? ?

## 2022-09-14 ENCOUNTER — Ambulatory Visit (INDEPENDENT_AMBULATORY_CARE_PROVIDER_SITE_OTHER): Payer: Self-pay

## 2022-09-14 ENCOUNTER — Ambulatory Visit (HOSPITAL_COMMUNITY)
Admission: EM | Admit: 2022-09-14 | Discharge: 2022-09-14 | Disposition: A | Discharge status: Home / Self Care | Payer: Self-pay | Attending: Family Medicine | Admitting: Family Medicine

## 2022-09-14 ENCOUNTER — Encounter (HOSPITAL_COMMUNITY): Payer: Self-pay | Admitting: *Deleted

## 2022-09-14 DIAGNOSIS — R051 Acute cough: Secondary | ICD-10-CM | POA: Insufficient documentation

## 2022-09-14 DIAGNOSIS — R0602 Shortness of breath: Secondary | ICD-10-CM

## 2022-09-14 DIAGNOSIS — J069 Acute upper respiratory infection, unspecified: Secondary | ICD-10-CM | POA: Insufficient documentation

## 2022-09-14 DIAGNOSIS — R059 Cough, unspecified: Secondary | ICD-10-CM

## 2022-09-14 DIAGNOSIS — Z1152 Encounter for screening for COVID-19: Secondary | ICD-10-CM | POA: Insufficient documentation

## 2022-09-14 LAB — RESP PANEL BY RT-PCR (FLU A&B, COVID) ARPGX2
Influenza A by PCR: NEGATIVE
Influenza B by PCR: NEGATIVE
SARS Coronavirus 2 by RT PCR: NEGATIVE

## 2022-09-14 MED ORDER — DOXYCYCLINE HYCLATE 100 MG PO CAPS: 100.0000 mg | ORAL_CAPSULE | Freq: Two times a day (BID) | ORAL | 0 refills | Status: AC

## 2022-09-14 NOTE — ED Triage Notes (Signed)
Pt states he has had a headache, chills and cough x 2 weeks. He has only taken a BC last night nothing prior.

## 2022-09-14 NOTE — Discharge Instructions (Signed)
Your chest x-ray was read is clear, but I am concerned there is a developing pneumonia in your right lower lung.  Take doxycycline 100 mg --1 capsule 2 times daily for 7 days   You have been swabbed for COVID and flu, and the test will result in the next 24 hours. Our staff will call you if positive. If the COVID test is positive, you should quarantine for 5 days from the start of your symptoms

## 2022-09-14 NOTE — ED Provider Notes (Signed)
[facility]    CSN: [id number] Arrival date & time: 09/14/22  1005      History   Chief Complaint Chief Complaint  Patient presents with   Cough   Chills   Headache    HPI Leonard Sweeney is a 51 y.o. male.    Cough Associated symptoms: headaches   Headache Associated symptoms: cough    Here for 2-week history of cough and congestion.  He also had sore throat.  He had vomiting the first day and that has resolved.  He is felt hot and cold the whole time.  About 2 days ago he started feeling weaker and short of breath.  Past Medical History:  Diagnosis Date   Anxiety    Depression     Patient Active Problem List   Diagnosis Date Noted   Cocaine-induced mood disorder ([place]) 09/30/2019   Fracture, [place], right, closed 06/13/2019    History reviewed. No pertinent surgical history.     Home Medications    Prior to Admission medications   Medication Sig Start Date End Date Taking? Authorizing Provider  doxycycline (VIBRAMYCIN) 100 MG capsule Take 1 capsule (100 mg total) by mouth 2 (two) times daily for 7 days. 09/14/22 09/21/22 Yes Barrett Henle, MD    Family History History reviewed. No pertinent family history.  Social History Social History   Tobacco Use   Smoking status: Never   Smokeless tobacco: Never  Vaping Use   Vaping Use: Never used  Substance Use Topics   Alcohol use: Not Currently    Comment: quart of beer several times a week   Drug use: Yes    Types: Cocaine     Allergies   Patient has no known allergies.   Review of Systems Review of Systems  Respiratory:  Positive for cough.   Neurological:  Positive for headaches.     Physical Exam Triage Vital Signs ED Triage Vitals  Enc Vitals Group     BP 09/14/22 1028 (!) 156/74     Pulse Rate 09/14/22 1028 98     Resp 09/14/22 1028 18     Temp 09/14/22 1028 99.3 F (37.4 C)     Temp Source 09/14/22 1028 Oral     SpO2 09/14/22 1028 96 %     Weight --       Height --      Head Circumference --      Peak Flow --      Pain Score 09/14/22 1027 8     Pain Loc --      Pain Edu? --      Excl. in [place]? --    No data found.  Updated Vital Signs BP (!) 156/74 (BP Location: Left Arm)   Pulse 98   Temp 99.3 F (37.4 C) (Oral)   Resp 18   SpO2 96%   Visual Acuity Right Eye Distance:   Left Eye Distance:   Bilateral Distance:    Right Eye Near:   Left Eye Near:    Bilateral Near:     Physical Exam Vitals reviewed.  Constitutional:      General: He is not in acute distress.    Appearance: He is not toxic-appearing.  HENT:     Right Ear: Tympanic membrane and ear canal normal.     Left Ear: Tympanic membrane and ear canal normal.     Nose: Nose normal.     Mouth/Throat:     Mouth: Mucous membranes are  moist.     Comments: There is erythema and white mucus draining. Eyes:     Extraocular Movements: Extraocular movements intact.     Conjunctiva/sclera: Conjunctivae normal.     Pupils: Pupils are equal, round, and reactive to light.  Cardiovascular:     Rate and Rhythm: Normal rate and regular rhythm.     Heart sounds: No murmur heard. Pulmonary:     Effort: No respiratory distress.     Breath sounds: No stridor. No wheezing, rhonchi or rales.  Abdominal:     Palpations: Abdomen is soft.     Tenderness: There is no abdominal tenderness.  Musculoskeletal:     Cervical back: Neck supple.  Lymphadenopathy:     Cervical: No cervical adenopathy.  Skin:    Capillary Refill: Capillary refill takes less than 2 seconds.     Coloration: Skin is not jaundiced or pale.  Neurological:     General: No focal deficit present.     Mental Status: He is alert and oriented to person, place, and time.  Psychiatric:        Behavior: Behavior normal.      UC Treatments / Results  Labs (all labs ordered are listed, but only abnormal results are displayed) Labs Reviewed  RESP PANEL BY RT-PCR (FLU A&B, COVID) [id number]     EKG   Radiology DG Chest 2 View  Result Date: 09/14/2022 CLINICAL DATA:  cough/URI symptoms x 2 weeks, now short of breath and feeling weaker EXAM: CHEST - 2 VIEW COMPARISON:  11/29/2019. FINDINGS: The heart size and mediastinal contours are within normal limits. Both lungs are clear. No pneumothorax or pleural effusion. There are thoracic degenerative changes. IMPRESSION: No active cardiopulmonary disease. Electronically Signed   By: Sammie Bench M.D.   On: 09/14/2022 11:26    Procedures Procedures (including critical care time)  Medications Ordered in UC Medications - No data to display  Initial Impression / Assessment and Plan / UC Course  I have reviewed the triage vital signs and the nursing notes.  Pertinent labs & imaging results that were available during my care of the patient were reviewed by me and considered in my medical decision making (see chart for details).        Chest x-ray is read as normal.  By my review I am concerned that there is a developing infiltrate in the right lower lobe.  Doxycycline is sent in and good Rx coupon is printed for him  Since he has some worsening of his symptoms with weakness in malaise and aching in the last 2 days, he is swabbed for flu and COVID, in case this is a new viral illness on top of another 1 that was taking a long time to resolve.  He is positive for flu I will consider that he started having symptoms 2 days ago and treat him with Tamiflu.  If he is positive for COVID, he is a candidate for Paxlovid, as his last EGFR was greater than 60 Final Clinical Impressions(s) / UC Diagnoses   Final diagnoses:  Viral upper respiratory tract infection  Acute cough     Discharge Instructions      Your chest x-ray was read is clear, but I am concerned there is a developing pneumonia in your right lower lung.  Take doxycycline 100 mg --1 capsule 2 times daily for 7 days   You have been swabbed for COVID and flu,  and the test will result in the next 24 hours.  Our staff will call you if positive. If the COVID test is positive, you should quarantine for 5 days from the start of your symptoms      ED Prescriptions     Medication Sig Dispense Auth. Provider   doxycycline (VIBRAMYCIN) 100 MG capsule Take 1 capsule (100 mg total) by mouth 2 (two) times daily for 7 days. 14 capsule Windy Carina, Gwenlyn Perking, MD      PDMP not reviewed this encounter.   Barrett Henle, MD 09/14/22 1141

## 2022-09-17 ENCOUNTER — Encounter (HOSPITAL_COMMUNITY): Payer: Self-pay | Admitting: Emergency Medicine

## 2022-09-17 ENCOUNTER — Ambulatory Visit (INDEPENDENT_AMBULATORY_CARE_PROVIDER_SITE_OTHER): Payer: Self-pay

## 2022-09-17 ENCOUNTER — Ambulatory Visit (HOSPITAL_COMMUNITY)
Admission: EM | Admit: 2022-09-17 | Discharge: 2022-09-17 | Disposition: A | Discharge status: Home / Self Care | Payer: Self-pay | Attending: Physician Assistant | Admitting: Physician Assistant

## 2022-09-17 ENCOUNTER — Other Ambulatory Visit: Payer: Self-pay

## 2022-09-17 DIAGNOSIS — J209 Acute bronchitis, unspecified: Secondary | ICD-10-CM

## 2022-09-17 DIAGNOSIS — J9801 Acute bronchospasm: Secondary | ICD-10-CM

## 2022-09-17 DIAGNOSIS — R059 Cough, unspecified: Secondary | ICD-10-CM

## 2022-09-17 MED ORDER — PREDNISONE 20 MG PO TABS: 40.0000 mg | ORAL_TABLET | Freq: Every day | ORAL | 0 refills | Status: AC

## 2022-09-17 MED ORDER — ALBUTEROL SULFATE HFA 108 (90 BASE) MCG/ACT IN AERS
INHALATION_SPRAY | RESPIRATORY_TRACT | Status: AC
  Filled 2022-09-17: qty 6.7

## 2022-09-17 MED ORDER — ALBUTEROL SULFATE HFA 108 (90 BASE) MCG/ACT IN AERS
2.0000 | INHALATION_SPRAY | Freq: Once | RESPIRATORY_TRACT | Status: AC
  Administered 2022-09-17: 2 via RESPIRATORY_TRACT

## 2022-09-17 MED ORDER — PROMETHAZINE-DM 6.25-15 MG/5ML PO SYRP: 5.0000 mL | ORAL_SOLUTION | Freq: Two times a day (BID) | ORAL | 0 refills | Status: DC | PRN

## 2022-09-17 NOTE — ED Triage Notes (Signed)
Cough started 2 1/2  weeks ago.  Patient was seen at ucc on 09/14/2022.  Reports cough seems worse.  Reports he did start taking medicine Sunday.

## 2022-09-17 NOTE — ED Notes (Signed)
In xray

## 2022-09-17 NOTE — ED Provider Notes (Signed)
[facility]    CSN: [id number] Arrival date & time: 09/17/22  1006      History   Chief Complaint Chief Complaint  Patient presents with   Cough    HPI Leonard Sweeney is a 51 y.o. male.   Patient presents today with a 2 to 3-week history of cough.  He was seen 09/14/2022 by our clinic at which point he tested negative for COVID and flu.  Chest x-ray was obtained that was read as normal by provider with concern for early onset pneumonia so he was started on doxycycline.  He has been taking medication as prescribed without improvement of symptoms.  Reports ongoing severe cough that prevents him from sleeping at night.  Denies any significant congestion or fever.  He has had some nausea and vomiting but attributes this to either the antibiotic or as a result of the cough.  He denies history of asthma or COPD.  He does not smoke.  Denies any additional antibiotics within the past 90 days outside of doxycycline prescribed at last visit.  He has been using over-the-counter medication without improvement of symptoms.  Denies history of diabetes.  Denies any recent steroid use.    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 No pertinent surgical history.     Home Medications    Prior to Admission medications   Medication Sig Start Date End Date Taking? Authorizing Provider  predniSONE (DELTASONE) 20 MG tablet Take 2 tablets (40 mg total) by mouth daily for 5 days. 09/17/22 09/22/22 Yes Tomasina Keasling K, PA-C  promethazine-dextromethorphan (PROMETHAZINE-DM) 6.25-15 MG/5ML syrup Take 5 mLs by mouth 2 (two) times daily as needed for cough. 09/17/22  Yes Hilmar Moldovan K, PA-C  doxycycline (VIBRAMYCIN) 100 MG capsule Take 1 capsule (100 mg total) by mouth 2 (two) times daily for 7 days.  09/14/22 09/21/22  Zenia Resides, MD    Family History History reviewed. No pertinent family history.  Social History Social History   Tobacco Use   Smoking status: Never   Smokeless tobacco: Never  Vaping Use   Vaping Use: Never used  Substance Use Topics   Alcohol use: Not Currently    Comment: quart of beer several times a week   Drug use: Yes    Types: Cocaine     Allergies   Patient has no known allergies.   Review of Systems Review of Systems  Constitutional:  Positive for activity change and fatigue. Negative for appetite change and fever.  HENT:  Negative for congestion, sinus pressure, sneezing and sore throat.   Respiratory:  Positive for cough, chest tightness and shortness of breath. Negative for wheezing.   Cardiovascular:  Negative for chest pain.  Gastrointestinal:  Positive for nausea and vomiting. Negative for abdominal pain and diarrhea.     Physical Exam Triage Vital Signs ED Triage Vitals  Enc Vitals Group     BP 09/17/22 1301 132/84     Pulse Rate 09/17/22 1301 81     Resp 09/17/22 1301 (!) 24     Temp 09/17/22 1301 98.5 F (36.9 C)     Temp Source 09/17/22 1301 Oral     SpO2 09/17/22 1301 96 %     Weight --      Height --  Head Circumference --      Peak Flow --      Pain Score 09/17/22 1259 7     Pain Loc --      Pain Edu? --      Excl. in GC? --    No data found.  Updated Vital Signs BP 132/84 (BP Location: Left Arm) Comment (BP Location): large cuff  Pulse 81   Temp 98.5 F (36.9 C) (Oral)   Resp (!) 24   SpO2 96%   Visual Acuity Right Eye Distance:   Left Eye Distance:   Bilateral Distance:    Right Eye Near:   Left Eye Near:    Bilateral Near:     Physical Exam Vitals reviewed.  Constitutional:      General: He is awake.     Appearance: Normal appearance. He is well-developed. He is not ill-appearing.     Comments: Very pleasant male appears stated age in no acute distress sitting comfortably in exam  room  HENT:     Head: Normocephalic and atraumatic.     Right Ear: External ear normal. There is impacted cerumen.     Left Ear: External ear normal. There is impacted cerumen.     Ears:     Comments: Cerumen impaction bilaterally    Nose: Nose normal.     Mouth/Throat:     Dentition: Abnormal dentition.     Pharynx: Uvula midline. No oropharyngeal exudate or posterior oropharyngeal erythema.  Cardiovascular:     Rate and Rhythm: Normal rate and regular rhythm.     Heart sounds: Normal heart sounds, S1 normal and S2 normal. No murmur heard. Pulmonary:     Effort: Pulmonary effort is normal. No accessory muscle usage or respiratory distress.     Breath sounds: No stridor. Examination of the right-lower field reveals decreased breath sounds. Examination of the left-lower field reveals decreased breath sounds. Decreased breath sounds present. No wheezing, rhonchi or rales.     Comments: Reactive cough with deep breathing Abdominal:     General: Bowel sounds are normal.     Palpations: Abdomen is soft.     Tenderness: There is no abdominal tenderness.  Neurological:     Mental Status: He is alert.  Psychiatric:        Behavior: Behavior is cooperative.      UC Treatments / Results  Labs (all labs ordered are listed, but only abnormal results are displayed) Labs Reviewed - No data to display  EKG   Radiology DG Chest 2 View  Result Date: 09/17/2022 CLINICAL DATA:  51 year old male presents for evaluation of worsening cough. EXAM: CHEST - 2 VIEW COMPARISON:  September 14, 2022. FINDINGS: The heart size and mediastinal contours are within normal limits. Both lungs are clear. The visualized skeletal structures are unremarkable. IMPRESSION: No active cardiopulmonary disease. Electronically Signed   By: Donzetta Kohut M.D.   On: 09/17/2022 13:35    Procedures Procedures (including critical care time)  Medications Ordered in UC Medications  albuterol (VENTOLIN HFA) 108 (90 Base)  MCG/ACT inhaler 2 puff (2 puffs Inhalation Given 09/17/22 1340)    Initial Impression / Assessment and Plan / UC Course  I have reviewed the triage vital signs and the nursing notes.  Pertinent labs & imaging results that were available during my care of the patient were reviewed by me and considered in my medical decision making (see chart for details).     Patient is well-appearing, afebrile, nontoxic, nontachycardic.  No  indication for viral testing as patient has already tested negative and has been symptomatic for several weeks this would not change management.  Chest x-ray was repeated which showed no acute cardiopulmonary disease.  He was instructed to complete course of antibiotics as previously prescribed.  Recommended he begin prednisone burst of 40 mg for 5 days to help manage symptoms.  Discussed that he is not to take NSAIDs with this medication due to risk of GI bleeding.  Can use Tylenol, Mucinex, Flonase over-the-counter.  He was given albuterol inhaler with improvement of symptoms.  Recommended he take this home and use this every 4-6 hours as needed for shortness of breath and coughing fits.  He was prescribed Promethazine DM for cough.  Discussed that this can be sedating and he is not to drive or drink alcohol while taking it.  Recommended that he rest and drink plenty of fluid.  If he has any worsening or changing symptoms including worsening cough, shortness of breath, fever not respond to medication, nausea/vomiting interfering with oral intake he needs to be seen immediately.  Strict return precautions given.  Work excuse note provided.  Final Clinical Impressions(s) / UC Diagnoses   Final diagnoses:  Acute bronchitis, unspecified organism  Bronchospasm     Discharge Instructions      Finish the antibiotics that you are prescribed at your last visit.  Start prednisone 40 mg in the morning.  Do not take NSAIDs with this medication including aspirin, ibuprofen/Advil,  naproxen/Aleve.  You can use acetaminophen/Tylenol.  Use your albuterol inhaler every 4-6 hours as needed.  Use Promethazine DM for cough.  This will make you sleepy so do not drive or drink alcohol with taking it.  You can use over-the-counter medication including Mucinex, Flonase, Tylenol.  You should rest and drink plenty of fluid.  If your symptoms are not improving within a few days or if you have any worsening symptoms including worsening cough, shortness of breath, fever, nausea/vomiting interfere with oral intake you need to be seen immediately.     ED Prescriptions     Medication Sig Dispense Auth. Provider   predniSONE (DELTASONE) 20 MG tablet Take 2 tablets (40 mg total) by mouth daily for 5 days. 10 tablet Blayre Papania K, PA-C   promethazine-dextromethorphan (PROMETHAZINE-DM) 6.25-15 MG/5ML syrup Take 5 mLs by mouth 2 (two) times daily as needed for cough. 118 mL Khristina Janota K, PA-C      PDMP not reviewed this encounter.   Jeani Hawking, PA-C 09/17/22 1406

## 2022-09-17 NOTE — Discharge Instructions (Signed)
Finish the antibiotics that you are prescribed at your last visit.  Start prednisone 40 mg in the morning.  Do not take NSAIDs with this medication including aspirin, ibuprofen/Advil, naproxen/Aleve.  You can use acetaminophen/Tylenol.  Use your albuterol inhaler every 4-6 hours as needed.  Use Promethazine DM for cough.  This will make you sleepy so do not drive or drink alcohol with taking it.  You can use over-the-counter medication including Mucinex, Flonase, Tylenol.  You should rest and drink plenty of fluid.  If your symptoms are not improving within a few days or if you have any worsening symptoms including worsening cough, shortness of breath, fever, nausea/vomiting interfere with oral intake you need to be seen immediately.

## 2023-03-07 ENCOUNTER — Emergency Department (HOSPITAL_COMMUNITY)
Admission: EM | Admit: 2023-03-07 | Discharge: 2023-03-07 | Disposition: A | Discharge status: Home / Self Care | Payer: Self-pay | Attending: Emergency Medicine | Admitting: Emergency Medicine

## 2023-03-07 ENCOUNTER — Other Ambulatory Visit: Payer: Self-pay

## 2023-03-07 ENCOUNTER — Emergency Department (HOSPITAL_COMMUNITY): Payer: Self-pay

## 2023-03-07 ENCOUNTER — Encounter (HOSPITAL_COMMUNITY): Payer: Self-pay

## 2023-03-07 DIAGNOSIS — J181 Lobar pneumonia, unspecified organism: Secondary | ICD-10-CM | POA: Insufficient documentation

## 2023-03-07 DIAGNOSIS — J189 Pneumonia, unspecified organism: Secondary | ICD-10-CM

## 2023-03-07 DIAGNOSIS — R000 Tachycardia, unspecified: Secondary | ICD-10-CM | POA: Insufficient documentation

## 2023-03-07 MED ORDER — KETOROLAC TROMETHAMINE 15 MG/ML IJ SOLN
15.0000 mg | Freq: Once | INTRAMUSCULAR | Status: AC
  Administered 2023-03-07: 15 mg via INTRAMUSCULAR
  Filled 2023-03-07: qty 1

## 2023-03-07 MED ORDER — BENZONATATE 100 MG PO CAPS: 100.0000 mg | ORAL_CAPSULE | Freq: Three times a day (TID) | ORAL | 0 refills | Status: AC | PRN

## 2023-03-07 MED ORDER — LIDOCAINE HCL (PF) 1 % IJ SOLN
INTRAMUSCULAR | Status: AC
  Administered 2023-03-07: 2.1 mL
  Filled 2023-03-07: qty 5

## 2023-03-07 MED ORDER — DEXAMETHASONE SODIUM PHOSPHATE 10 MG/ML IJ SOLN
10.0000 mg | Freq: Once | INTRAMUSCULAR | Status: AC
  Administered 2023-03-07: 10 mg via INTRAMUSCULAR
  Filled 2023-03-07: qty 1

## 2023-03-07 MED ORDER — ALBUTEROL SULFATE HFA 108 (90 BASE) MCG/ACT IN AERS
1.0000 | INHALATION_SPRAY | Freq: Once | RESPIRATORY_TRACT | Status: AC
  Administered 2023-03-07: 2 via RESPIRATORY_TRACT
  Filled 2023-03-07: qty 6.7

## 2023-03-07 MED ORDER — IPRATROPIUM BROMIDE 0.02 % IN SOLN
0.5000 mg | Freq: Once | RESPIRATORY_TRACT | Status: AC
  Administered 2023-03-07: 0.5 mg via RESPIRATORY_TRACT
  Filled 2023-03-07: qty 2.5

## 2023-03-07 MED ORDER — BENZONATATE 100 MG PO CAPS
100.0000 mg | ORAL_CAPSULE | Freq: Once | ORAL | Status: AC
  Administered 2023-03-07: 100 mg via ORAL
  Filled 2023-03-07: qty 1

## 2023-03-07 MED ORDER — ALBUTEROL SULFATE (2.5 MG/3ML) 0.083% IN NEBU
15.0000 mg/h | INHALATION_SOLUTION | RESPIRATORY_TRACT | Status: AC
  Administered 2023-03-07: 15 mg/h via RESPIRATORY_TRACT
  Filled 2023-03-07: qty 3
  Filled 2023-03-07: qty 18

## 2023-03-07 MED ORDER — ALBUTEROL SULFATE HFA 108 (90 BASE) MCG/ACT IN AERS: 1.0000 | INHALATION_SPRAY | Freq: Four times a day (QID) | RESPIRATORY_TRACT | 0 refills | Status: DC | PRN

## 2023-03-07 MED ORDER — CEFTRIAXONE SODIUM 1 G IJ SOLR
1.0000 g | Freq: Once | INTRAMUSCULAR | Status: AC
  Administered 2023-03-07: 1 g via INTRAMUSCULAR
  Filled 2023-03-07: qty 10

## 2023-03-07 MED ORDER — AMOXICILLIN-POT CLAVULANATE 875-125 MG PO TABS: 1.0000 | ORAL_TABLET | Freq: Two times a day (BID) | ORAL | 0 refills | Status: AC

## 2023-03-07 NOTE — ED Triage Notes (Signed)
Pt c/o HA, productive cough, fevers since past Monday; no known sick contacts; taking mucinex  and Nyquil at home without relief

## 2023-03-07 NOTE — ED Notes (Signed)
DC instructions reviewed with pt. PT verbalized understanding. PT DC °

## 2023-03-07 NOTE — Discharge Instructions (Signed)
Thank you for letting us take care of you today.   Your x-ray shows a left sided pneumonia. You were also wheezing in the ED today. We gave you a dose of steroids, breathing treatments, and a dose of antibiotics to treat your pneumonia. I am sending you home on antibiotics to treat the infection as well as a medication to help your cough as needed. Use the inhaler provided as needed to help with your breathing. I recommend being rechecked by your PCP next week to ensure your symptoms are improving and for repeat chest x-ray if they continue. If you do not have a PCP, I provided 2 clinics you may follow up with.  For any worsening condition including worsening of your breathing, high fevers, or other concerns, return to nearest ED for re-evaluation.

## 2023-03-07 NOTE — ED Provider Notes (Signed)
[place] [facility] AT [facility] Provider Note   CSN: [id number] Arrival date & time: 03/07/23  0865     History  No chief complaint on file.   Leonard Sweeney is a 52 y.o. male with a past medical history anxiety, depression, and substance use disorder who presents to the ED complaining of intermittently productive cough for the last 5 days.  Associated congestion and pleuritic chest pain.  History of similar symptoms.  No known sick contacts.  Subjective fever at home.  No nausea, vomiting, diarrhea, or abdominal pain. No sore throat. No leg pain or swelling. Pt denies tobacco use.        Allergies    Patient has no known allergies.    Review of Systems   Review of Systems  All other systems reviewed and are negative.   Physical Exam Updated Vital Signs BP (!) 149/64   Pulse (!) 102   Temp 99.3 F (37.4 C) (Oral)   Resp (!) 24   Ht 5\' 11"  (1.803 m)   Wt 93 kg   SpO2 97%   BMI 28.59 kg/m  Physical Exam Vitals and nursing note reviewed.  Constitutional:      General: He is not in acute distress.    Appearance: Normal appearance. He is not ill-appearing or toxic-appearing.  HENT:     Head: Normocephalic and atraumatic.     Mouth/Throat:     Mouth: Mucous membranes are moist.     Pharynx: Oropharynx is clear. No oropharyngeal exudate or posterior oropharyngeal erythema.     Comments: Normal phonation Eyes:     General: No scleral icterus.    Conjunctiva/sclera: Conjunctivae normal.  Neck:     Comments: No meningismus Cardiovascular:     Rate and Rhythm: Regular rhythm. Tachycardia present.     Heart sounds: No murmur heard. Pulmonary:     Effort: Pulmonary effort is normal. No respiratory distress.     Breath sounds: No stridor. Wheezing (occasional expiratory) and rales (LLL) present. No rhonchi.     Comments: Frequent dry cough Abdominal:     General: Abdomen is flat.     Palpations: Abdomen is soft.     Tenderness: There is no  abdominal tenderness.  Musculoskeletal:        General: Normal range of motion.     Cervical back: Normal range of motion and neck supple. No rigidity.     Right lower leg: No edema.     Left lower leg: No edema.  Skin:    General: Skin is warm and dry.     Capillary Refill: Capillary refill takes less than 2 seconds.  Neurological:     Mental Status: He is alert. Mental status is at baseline.  Psychiatric:        Behavior: Behavior normal.     ED Results / Procedures / Treatments   Labs (all labs ordered are listed, but only abnormal results are displayed) Labs Reviewed - No data to display  EKG None  Radiology DG Chest 2 View  Result Date: 03/07/2023 CLINICAL DATA:  Productive cough, fever EXAM: CHEST - 2 VIEW COMPARISON:  09/17/2022 FINDINGS: Transverse diameter of heart is slightly increased. There are no signs of pulmonary edema. There is new focal infiltrate in the posteromedial left lower lung fields suggesting pneumonia in left lower lobe. There is minimal blunting of left lateral CP angle. There is no pneumothorax. IMPRESSION: New patchy infiltrate is seen in posteromedial left lower lung fields suggesting  left lower lobe pneumonia. Small left pleural effusion. Electronically Signed   By: Ernie Avena M.D.   On: 03/07/2023 11:19    Procedures Procedures    Medications Ordered in ED Medications  albuterol (PROVENTIL) (2.5 MG/3ML) 0.083% nebulizer solution (15 mg/hr Nebulization New Bag/Given 03/07/23 1031)  cefTRIAXone (ROCEPHIN) injection 1 g (has no administration in time range)  dexamethasone (DECADRON) injection 10 mg (10 mg Intramuscular Given 03/07/23 1054)  benzonatate (TESSALON) capsule 100 mg (100 mg Oral Given 03/07/23 1054)  albuterol (VENTOLIN HFA) 108 (90 Base) MCG/ACT inhaler 1-2 puff (2 puffs Inhalation Given 03/07/23 1053)  ipratropium (ATROVENT) nebulizer solution 0.5 mg (0.5 mg Nebulization Given 03/07/23 1032)  ketorolac (TORADOL) 15 MG/ML injection 15  mg (15 mg Intramuscular Given 03/07/23 1055)    ED Course/ Medical Decision Making/ A&P                             Medical Decision Making Amount and/or Complexity of Data Reviewed Radiology: ordered. Decision-making details documented in ED Course.  Risk Prescription drug management.   Medical Decision Making:   Leonard Sweeney is a 52 y.o. male who presented to the ED today with cough detailed above.    Patient's presentation is complicated by their history of substance use.  Complete initial physical exam performed, notably the patient was in NAD. Occasional expiratory wheezing with frequent dry cough. LLL rales. No respiratory distress. No stridor. Normal phonation. No meningismus.    Reviewed and confirmed nursing documentation for past medical history, family history, social history.    Initial Assessment:   With the patient's presentation, differential diagnosis includes but is not limited to upper respiratory infection, lower respiratory infection, allergies, asthma, irritants, foreign body, medications such as ACE inhibitors, reflux, asthma, CHF, lung cancer, interstitial lung disease, psychiatric causes, postnasal drip and postinfectious bronchospasm.   This is most consistent with an acute complicated illness  Initial Plan:  CXR to evaluate for structural/infectious intrathoracic pathology.  EKG to evaluate for cardiac pathology Symptomatic treatment Objective evaluation as below reviewed   Initial Study Results:   EKG EKG was reviewed independently. NSR. ST segments without concerns for elevations.    Radiology:  All images reviewed independently. Agree with radiology report at this time.   DG Chest 2 View  Result Date: 03/07/2023 CLINICAL DATA:  Productive cough, fever EXAM: CHEST - 2 VIEW COMPARISON:  09/17/2022 FINDINGS: Transverse diameter of heart is slightly increased. There are no signs of pulmonary edema. There is new focal infiltrate in the posteromedial left  lower lung fields suggesting pneumonia in left lower lobe. There is minimal blunting of left lateral CP angle. There is no pneumothorax. IMPRESSION: New patchy infiltrate is seen in posteromedial left lower lung fields suggesting left lower lobe pneumonia. Small left pleural effusion. Electronically Signed   By: Ernie Avena M.D.   On: 03/07/2023 11:19      Final Assessment and Plan:   This is a 52 year old male who presents to the ED complaining of a cough for the last 5 days.  Also complains of pleuritic chest pain.  EKG normal sinus rhythm without acute ST-T changes.  No known sick contacts.  Patient maintaining oxygen saturation on room air.  He does have expiratory wheezing as well as rales to the left lung base.  Slight tachycardia on initial exam that improves when at rest.  Patient given a continuous nebulizer treatment, dose of Decadron with subjective improvement in symptoms  and near resolution of wheezing.  X-ray shows a left lower lobe pneumonia.  No recent antibiotic use.  Discussed findings with patient.  He states that he is comfortable going home on antibiotics and following up or returning if needed.  Will give dose of Rocephin in the ED prior to discharge.  Tessalon provided for symptomatic relief at home.  Inhaler given in the ED and pt will take this home to use as needed. Strict ED return precautions given, all questions answered, and stable for discharge.    Clinical Impression:  1. Pneumonia of left lower lobe due to infectious organism      Discharge           Final Clinical Impression(s) / ED Diagnoses Final diagnoses:  Pneumonia of left lower lobe due to infectious organism    Rx / DC Orders ED Discharge Orders          Ordered    amoxicillin-clavulanate (AUGMENTIN) 875-125 MG tablet  2 times daily        03/07/23 1130    benzonatate (TESSALON) 100 MG capsule  3 times daily PRN        03/07/23 1130    albuterol (VENTOLIN HFA) 108 (90 Base) MCG/ACT  inhaler  Every 6 hours PRN        03/07/23 1130              Tonette Lederer, PA-C 03/07/23 1145    Rondel Baton, MD 03/12/23 [phone]

## 2023-05-08 ENCOUNTER — Emergency Department (HOSPITAL_COMMUNITY): Payer: BLUE CROSS/BLUE SHIELD

## 2023-05-08 ENCOUNTER — Emergency Department (HOSPITAL_COMMUNITY)
Admission: EM | Admit: 2023-05-08 | Discharge: 2023-05-08 | Disposition: A | Discharge status: Home / Self Care | Payer: BLUE CROSS/BLUE SHIELD | Attending: Emergency Medicine | Admitting: Emergency Medicine

## 2023-05-08 ENCOUNTER — Other Ambulatory Visit: Payer: Self-pay

## 2023-05-08 ENCOUNTER — Encounter (HOSPITAL_COMMUNITY): Payer: Self-pay

## 2023-05-08 DIAGNOSIS — M542 Cervicalgia: Secondary | ICD-10-CM | POA: Insufficient documentation

## 2023-05-08 DIAGNOSIS — R519 Headache, unspecified: Secondary | ICD-10-CM | POA: Insufficient documentation

## 2023-05-08 DIAGNOSIS — M549 Dorsalgia, unspecified: Secondary | ICD-10-CM | POA: Diagnosis not present

## 2023-05-08 DIAGNOSIS — F1410 Cocaine abuse, uncomplicated: Secondary | ICD-10-CM

## 2023-05-08 DIAGNOSIS — Y92410 Unspecified street and highway as the place of occurrence of the external cause: Secondary | ICD-10-CM | POA: Diagnosis not present

## 2023-05-08 LAB — CBC WITH DIFFERENTIAL/PLATELET
Abs Immature Granulocytes: 0.02 10*3/uL (ref 0.00–0.07)
Basophils Absolute: 0 10*3/uL (ref 0.0–0.1)
Basophils Relative: 0 %
Eosinophils Absolute: 0.1 10*3/uL (ref 0.0–0.5)
Eosinophils Relative: 1 %
HCT: 39.3 % (ref 39.0–52.0)
Hemoglobin: 12.8 g/dL — ABNORMAL LOW (ref 13.0–17.0)
Immature Granulocytes: 0 %
Lymphocytes Relative: 27 %
Lymphs Abs: 1.7 10*3/uL (ref 0.7–4.0)
MCH: 31.2 pg (ref 26.0–34.0)
MCHC: 32.6 g/dL (ref 30.0–36.0)
MCV: 95.9 fL (ref 80.0–100.0)
Monocytes Absolute: 0.6 10*3/uL (ref 0.1–1.0)
Monocytes Relative: 10 %
Neutro Abs: 3.9 10*3/uL (ref 1.7–7.7)
Neutrophils Relative %: 62 %
Platelets: 221 10*3/uL (ref 150–400)
RBC: 4.1 MIL/uL — ABNORMAL LOW (ref 4.22–5.81)
RDW: 14.6 % (ref 11.5–15.5)
WBC: 6.3 10*3/uL (ref 4.0–10.5)
nRBC: 0 % (ref 0.0–0.2)

## 2023-05-08 LAB — RAPID URINE DRUG SCREEN, HOSP PERFORMED
Amphetamines: NOT DETECTED
Barbiturates: NOT DETECTED
Benzodiazepines: NOT DETECTED
Cocaine: POSITIVE — AB
Opiates: NOT DETECTED
Tetrahydrocannabinol: NOT DETECTED

## 2023-05-08 LAB — BASIC METABOLIC PANEL
Anion gap: 10 (ref 5–15)
BUN: 11 mg/dL (ref 6–20)
CO2: 24 mmol/L (ref 22–32)
Calcium: 9.1 mg/dL (ref 8.9–10.3)
Chloride: 103 mmol/L (ref 98–111)
Creatinine, Ser: 1.05 mg/dL (ref 0.61–1.24)
GFR, Estimated: >60 mL/min (ref >60)
Glucose, Bld: 95 mg/dL (ref 70–99)
Potassium: 3.9 mmol/L (ref 3.5–5.1)
Sodium: 137 mmol/L (ref 135–145)

## 2023-05-08 LAB — ETHANOL: Alcohol, Ethyl (B): <10 mg/dL (ref <10)

## 2023-05-08 MED ORDER — IBUPROFEN 600 MG PO TABS: 600.0000 mg | ORAL_TABLET | Freq: Four times a day (QID) | ORAL | 0 refills | Status: DC | PRN

## 2023-05-08 MED ORDER — CYCLOBENZAPRINE HCL 10 MG PO TABS: 10.0000 mg | ORAL_TABLET | Freq: Two times a day (BID) | ORAL | 0 refills | Status: DC | PRN

## 2023-05-08 MED ORDER — OXYCODONE HCL 5 MG PO TABS: 5.0000 mg | ORAL_TABLET | Freq: Four times a day (QID) | ORAL | 0 refills | Status: DC | PRN

## 2023-05-08 MED ORDER — HYDROCODONE-ACETAMINOPHEN 5-325 MG PO TABS
1.0000 | ORAL_TABLET | Freq: Once | ORAL | Status: AC
  Administered 2023-05-08: 1 via ORAL
  Filled 2023-05-08: qty 1

## 2023-05-08 MED ORDER — ACETAMINOPHEN 325 MG PO TABS: 650.0000 mg | ORAL_TABLET | Freq: Four times a day (QID) | ORAL | 0 refills | Status: DC | PRN

## 2023-05-08 NOTE — ED Notes (Signed)
Patient sitting in wheelchair, texting on phone, no acute distress noted at this time.
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
## 2023-05-08 NOTE — Discharge Instructions (Signed)
It was a pleasure caring for you today in the emergency department. ° °Please return to the emergency department for any worsening or worrisome symptoms. ° ° °

## 2024-08-10 ENCOUNTER — Inpatient Hospital Stay (HOSPITAL_COMMUNITY)
Admission: EM | Admit: 2024-08-10 | Discharge: 2024-08-15 | DRG: 871 | Disposition: A | Discharge status: Home / Self Care | Payer: Self-pay | Attending: Emergency Medicine | Admitting: Emergency Medicine

## 2024-08-10 ENCOUNTER — Encounter (HOSPITAL_COMMUNITY): Payer: Self-pay

## 2024-08-10 ENCOUNTER — Other Ambulatory Visit: Payer: Self-pay

## 2024-08-10 ENCOUNTER — Ambulatory Visit (HOSPITAL_COMMUNITY)
Admission: EM | Admit: 2024-08-10 | Discharge: 2024-08-10 | Disposition: A | Discharge status: Home / Self Care | Payer: Self-pay | Attending: Internal Medicine | Admitting: Internal Medicine

## 2024-08-10 ENCOUNTER — Encounter (HOSPITAL_COMMUNITY): Payer: Self-pay | Admitting: Emergency Medicine

## 2024-08-10 ENCOUNTER — Emergency Department (HOSPITAL_COMMUNITY): Payer: Self-pay

## 2024-08-10 DIAGNOSIS — Z79899 Other long term (current) drug therapy: Secondary | ICD-10-CM

## 2024-08-10 DIAGNOSIS — Z6833 Body mass index (BMI) 33.0-33.9, adult: Secondary | ICD-10-CM

## 2024-08-10 DIAGNOSIS — I959 Hypotension, unspecified: Secondary | ICD-10-CM

## 2024-08-10 DIAGNOSIS — R61 Generalized hyperhidrosis: Secondary | ICD-10-CM

## 2024-08-10 DIAGNOSIS — J9601 Acute respiratory failure with hypoxia: Secondary | ICD-10-CM | POA: Diagnosis present

## 2024-08-10 DIAGNOSIS — R051 Acute cough: Secondary | ICD-10-CM

## 2024-08-10 DIAGNOSIS — R0602 Shortness of breath: Secondary | ICD-10-CM

## 2024-08-10 DIAGNOSIS — R6520 Severe sepsis without septic shock: Secondary | ICD-10-CM | POA: Diagnosis present

## 2024-08-10 DIAGNOSIS — E66811 Obesity, class 1: Secondary | ICD-10-CM | POA: Diagnosis present

## 2024-08-10 DIAGNOSIS — J189 Pneumonia, unspecified organism: Principal | ICD-10-CM | POA: Diagnosis present

## 2024-08-10 DIAGNOSIS — R7989 Other specified abnormal findings of blood chemistry: Secondary | ICD-10-CM | POA: Diagnosis present

## 2024-08-10 DIAGNOSIS — F1410 Cocaine abuse, uncomplicated: Secondary | ICD-10-CM | POA: Diagnosis present

## 2024-08-10 DIAGNOSIS — A419 Sepsis, unspecified organism: Principal | ICD-10-CM | POA: Diagnosis present

## 2024-08-10 DIAGNOSIS — N179 Acute kidney failure, unspecified: Secondary | ICD-10-CM | POA: Diagnosis not present

## 2024-08-10 DIAGNOSIS — I2489 Other forms of acute ischemic heart disease: Secondary | ICD-10-CM | POA: Diagnosis not present

## 2024-08-10 DIAGNOSIS — R0902 Hypoxemia: Secondary | ICD-10-CM

## 2024-08-10 DIAGNOSIS — Z1152 Encounter for screening for COVID-19: Secondary | ICD-10-CM

## 2024-08-10 LAB — CBC WITH DIFFERENTIAL/PLATELET
Abs Immature Granulocytes: 0.04 K/uL (ref 0.00–0.07)
Basophils Absolute: 0 K/uL (ref 0.0–0.1)
Basophils Relative: 0 %
Eosinophils Absolute: 0.1 K/uL (ref 0.0–0.5)
Eosinophils Relative: 2 %
HCT: 34.3 % — ABNORMAL LOW (ref 39.0–52.0)
Hemoglobin: 11.5 g/dL — ABNORMAL LOW (ref 13.0–17.0)
Immature Granulocytes: 1 %
Lymphocytes Relative: 18 %
Lymphs Abs: 1.2 K/uL (ref 0.7–4.0)
MCH: 30.7 pg (ref 26.0–34.0)
MCHC: 33.5 g/dL (ref 30.0–36.0)
MCV: 91.5 fL (ref 80.0–100.0)
Monocytes Absolute: 1.1 K/uL — ABNORMAL HIGH (ref 0.1–1.0)
Monocytes Relative: 16 %
Neutro Abs: 4.3 K/uL (ref 1.7–7.7)
Neutrophils Relative %: 63 %
Platelets: 195 K/uL (ref 150–400)
RBC: 3.75 MIL/uL — ABNORMAL LOW (ref 4.22–5.81)
RDW: 13.4 % (ref 11.5–15.5)
WBC: 6.8 K/uL (ref 4.0–10.5)
nRBC: 0 % (ref 0.0–0.2)

## 2024-08-10 LAB — COMPREHENSIVE METABOLIC PANEL WITH GFR
ALT: 35 U/L (ref 0–44)
AST: 56 U/L — ABNORMAL HIGH (ref 15–41)
Albumin: 2.6 g/dL — ABNORMAL LOW (ref 3.5–5.0)
Alkaline Phosphatase: 55 U/L (ref 38–126)
Anion gap: 10 (ref 5–15)
BUN: 11 mg/dL (ref 6–20)
CO2: 24 mmol/L (ref 22–32)
Calcium: 8.1 mg/dL — ABNORMAL LOW (ref 8.9–10.3)
Chloride: 100 mmol/L (ref 98–111)
Creatinine, Ser: 1.17 mg/dL (ref 0.61–1.24)
GFR, Estimated: >60 mL/min (ref >60)
Glucose, Bld: 111 mg/dL — ABNORMAL HIGH (ref 70–99)
Potassium: 3.5 mmol/L (ref 3.5–5.1)
Sodium: 134 mmol/L — ABNORMAL LOW (ref 135–145)
Total Bilirubin: 0.8 mg/dL (ref 0.0–1.2)
Total Protein: 7 g/dL (ref 6.5–8.1)

## 2024-08-10 LAB — I-STAT CG4 LACTIC ACID, ED: Lactic Acid, Venous: 1.1 mmol/L (ref 0.5–1.9)

## 2024-08-10 LAB — RESP PANEL BY RT-PCR (RSV, FLU A&B, COVID)  RVPGX2
Influenza A by PCR: NEGATIVE
Influenza B by PCR: NEGATIVE
Resp Syncytial Virus by PCR: NEGATIVE
SARS Coronavirus 2 by RT PCR: NEGATIVE

## 2024-08-10 LAB — GLUCOSE, POCT (MANUAL RESULT ENTRY): POCT Glucose (KUC): 120 mg/dL — AB (ref 70–99)

## 2024-08-10 LAB — TROPONIN I (HIGH SENSITIVITY): Troponin I (High Sensitivity): 36 ng/L — ABNORMAL HIGH (ref <18)

## 2024-08-10 MED ORDER — IPRATROPIUM-ALBUTEROL 0.5-2.5 (3) MG/3ML IN SOLN
RESPIRATORY_TRACT | Status: AC
  Filled 2024-08-10: qty 3

## 2024-08-10 MED ORDER — BENZONATATE 100 MG PO CAPS
200.0000 mg | ORAL_CAPSULE | Freq: Three times a day (TID) | ORAL | Status: DC | PRN
Start: 2024-08-10 — End: 2024-08-15
  Administered 2024-08-11 (×2): 200 mg via ORAL
  Filled 2024-08-10 (×2): qty 2

## 2024-08-10 MED ORDER — GUAIFENESIN ER 600 MG PO TB12
600.0000 mg | ORAL_TABLET | Freq: Two times a day (BID) | ORAL | Status: DC
  Administered 2024-08-10 – 2024-08-11 (×2): 600 mg via ORAL
  Filled 2024-08-10 (×2): qty 1

## 2024-08-10 MED ORDER — IPRATROPIUM-ALBUTEROL 0.5-2.5 (3) MG/3ML IN SOLN
3.0000 mL | Freq: Once | RESPIRATORY_TRACT | Status: AC
  Administered 2024-08-10: 3 mL via RESPIRATORY_TRACT

## 2024-08-10 MED ORDER — SODIUM CHLORIDE 0.9 % IV SOLN
500.0000 mg | INTRAVENOUS | Status: DC
  Administered 2024-08-11: 500 mg via INTRAVENOUS
  Filled 2024-08-10: qty 5

## 2024-08-10 MED ORDER — MENTHOL 3 MG MT LOZG: 1.0000 | LOZENGE | OROMUCOSAL | Status: DC | PRN

## 2024-08-10 MED ORDER — MELATONIN 3 MG PO TABS
3.0000 mg | ORAL_TABLET | Freq: Every evening | ORAL | Status: DC | PRN
  Administered 2024-08-11 – 2024-08-14 (×4): 3 mg via ORAL
  Filled 2024-08-10 (×5): qty 1

## 2024-08-10 MED ORDER — SODIUM CHLORIDE 0.9 % IV SOLN
1.0000 g | Freq: Once | INTRAVENOUS | Status: AC
  Administered 2024-08-10: 1 g via INTRAVENOUS
  Filled 2024-08-10: qty 10

## 2024-08-10 MED ORDER — SODIUM CHLORIDE 0.9 % IV SOLN
500.0000 mg | Freq: Once | INTRAVENOUS | Status: AC
  Administered 2024-08-11: 500 mg via INTRAVENOUS
  Filled 2024-08-10: qty 5

## 2024-08-10 MED ORDER — SODIUM CHLORIDE 0.9 % IV SOLN
1.0000 g | INTRAVENOUS | Status: DC
  Administered 2024-08-11: 1 g via INTRAVENOUS
  Filled 2024-08-10: qty 10

## 2024-08-10 MED ORDER — ONDANSETRON HCL 4 MG/2ML IJ SOLN: 4.0000 mg | Freq: Four times a day (QID) | INTRAMUSCULAR | Status: DC | PRN

## 2024-08-10 MED ORDER — ACETAMINOPHEN 650 MG RE SUPP: 650.0000 mg | Freq: Four times a day (QID) | RECTAL | Status: DC | PRN

## 2024-08-10 MED ORDER — ACETAMINOPHEN 325 MG PO TABS
650.0000 mg | ORAL_TABLET | Freq: Four times a day (QID) | ORAL | Status: DC | PRN
  Administered 2024-08-11 (×4): 650 mg via ORAL
  Filled 2024-08-10 (×4): qty 2

## 2024-08-10 NOTE — ED Triage Notes (Signed)
 Pt bib [facility] from [facility] for cp/sob. Pt was tachypneic in UC and in the 80s on room air. UC also reports pt was diaphoretic upon arrival to UC.  Pt received a duoneb at UC, lung sounds diminished.

## 2024-08-10 NOTE — ED Provider Notes (Signed)
 [facility]    CSN: [id number] Arrival date & time: 08/10/24  1734      History   Chief Complaint Chief Complaint  Patient presents with   Cough    HPI Leonard Sweeney is a 53 y.o. adult.   Leonard Sweeney is a 53 y.o. male presenting for chief complaint of cough, nasal congestion, generalized fatigue, and fever/chills that started approximately 8-9 days ago.  Reports shortness of breath associated with coughing and at rest.  He has had intermittent central substernal chest pain for the last few days that he currently rates at a 2 on a scale of 0-10 and describes as a sharp pain that comes and goes.  He has had a few episodes of nausea and vomiting during illness, last episode of vomiting was yesterday.  He remains nauseous and with decreased appetite.  He last ate this morning.  Denies recent sick contacts with similar symptoms.  Never smoker, denies history of asthma or COPD, denies alcohol use.  Denies leg swelling and orthopnea.  He is unsure if he has had a fever at home. Currently abuses cocaine, last use of cocaine was yesterday.     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 No pertinent surgical history.  OB History   No obstetric history on file.      Home Medications    Prior to Admission medications   Medication Sig Start Date End Date Taking? Authorizing Provider  acetaminophen  (TYLENOL ) 325 MG tablet Take 2 tablets (650 mg total) by mouth every 6 (six) hours as needed for mild pain (pain score 1-3) (or Fever >/= 101).  08/15/24   Elgergawy, Brayton RAMAN, MD  amoxicillin -clavulanate (AUGMENTIN ) 875-125 MG tablet Take 1 tablet by mouth 2 (two) times daily for 3 days. 08/15/24 08/18/24  Elgergawy, Brayton RAMAN, MD  aspirin EC 81 MG tablet Take 1 tablet (81 mg total) by mouth daily. Swallow whole. 08/15/24   Elgergawy, Brayton RAMAN, MD    Family History History reviewed. No pertinent family history.  Social History Social History   Tobacco Use   Smoking status: Never   Smokeless tobacco: Never  Vaping Use   Vaping status: Never Used  Substance Use Topics   Alcohol use: Not Currently   Drug use: Yes    Types: Cocaine     Allergies   Patient has no known allergies.   Review of Systems Review of Systems Per HPI  Physical Exam Triage Vital Signs ED Triage Vitals [08/10/24 1848]  Encounter Vitals Group     BP      Girls Systolic BP Percentile      Girls Diastolic BP Percentile      Boys Systolic BP Percentile      Boys Diastolic BP Percentile      Pulse Rate 93     Resp (!) 32     Temp      Temp src      SpO2 97 %     Weight      Height  Head Circumference      Peak Flow      Pain Score      Pain Loc      Pain Education      Exclude from Growth Chart    No data found.  Updated Vital Signs BP (!) 111/57 (BP Location: Left Arm) Comment (BP Location): large cuff  Pulse 90   Temp 97.8 F (36.6 C) (Oral) Comment: using a different piece of equipment  Resp (!) 24   SpO2 98%   Visual Acuity Right Eye Distance:   Left Eye Distance:   Bilateral Distance:    Right Eye Near:   Left Eye Near:    Bilateral Near:     Physical Exam Vitals and nursing note reviewed.  Constitutional:      Appearance: He is ill-appearing and diaphoretic. He is not toxic-appearing.  HENT:     Head: Normocephalic and atraumatic.     Right Ear: Hearing and external ear normal.     Left Ear: Hearing and external ear normal.     Nose: Nose normal.     Mouth/Throat:     Lips: Pink.  Eyes:     General:  Lids are normal. Vision grossly intact. Gaze aligned appropriately.     Extraocular Movements: Extraocular movements intact.     Conjunctiva/sclera: Conjunctivae normal.  Cardiovascular:     Rate and Rhythm: Regular rhythm. Tachycardia present.     Heart sounds: Normal heart sounds, S1 normal and S2 normal.  Pulmonary:     Effort: Tachypnea, accessory muscle usage and respiratory distress present.     Breath sounds: Normal air entry. No stridor, decreased air movement or transmitted upper airway sounds. Decreased breath sounds present. No wheezing, rhonchi or rales.     Comments: Decreased breath sounds to all lung fields bilaterally. Speaking in very short sentences with moderate difficulty and tachypnea.  Musculoskeletal:     Cervical back: Neck supple.  Skin:    General: Skin is warm.     Capillary Refill: Capillary refill takes less than 2 seconds.     Findings: No rash.  Neurological:     General: No focal deficit present.     Mental Status: He is alert and oriented to person, place, and time. Mental status is at baseline.     Cranial Nerves: No dysarthria or facial asymmetry.  Psychiatric:        Mood and Affect: Mood normal.        Speech: Speech normal.        Behavior: Behavior normal.        Thought Content: Thought content normal.        Judgment: Judgment normal.      UC Treatments / Results  Labs (all labs ordered are listed, but only abnormal results are displayed) Labs Reviewed  GLUCOSE, POCT (MANUAL RESULT ENTRY) - Abnormal; Notable for the following components:      Result Value   POCT Glucose ([facility]) 120 (*)    All other components within normal limits    EKG   Radiology No results found.  Procedures Procedures (including critical care time)  Medications Ordered in UC Medications  ipratropium-albuterol  (DUONEB) 0.5-2.5 (3) MG/3ML nebulizer solution 3 mL (3 mLs Nebulization Given 08/10/24 1857)    Initial Impression / Assessment and Plan / UC  Course  I have reviewed the triage vital signs and the nursing notes.  Pertinent labs & imaging results that were available during my care of the patient  were reviewed by me and considered in my medical decision making (see chart for details).   1. Shortness of breath, hypotension, acute cough, diaphoresis Patient with 8-9 day history of cough and congestion with progressively worsening shortness of breath presents to the urgent care for evaluation. Ill-appearing, diaphoretic on arrival, decreased breath sounds.  Duoneb given without much improvement in lung sounds or clinical appearance/shortness of breath.  Oxygen saturation prior to duoneb 90-93% on room air, afterwards still 90-92%.  2L nasal cannula oxygen administered with improvement in shortness of breath after duoneb.  EKG nonischemic.  Concern for sepsis due to hypotension, tachypnea. Clinical suspicion for pneumonia, PE, acute cardiopulmonary abnormality.  Recommend transfer to the ER for further workup and evaluation via [facility].  Patient is agreeable with plan.  IV placed to right hand, normal saline bolus started, oxygen continued, discharged from UC to ER via [facility] in stable condition.    Final Clinical Impressions(s) / UC Diagnoses   Final diagnoses:  Shortness of breath  Hypotension, unspecified hypotension type  Acute cough  Diaphoresis   Discharge Instructions   None    ED Prescriptions   None    PDMP not reviewed this encounter.   Enedelia Dorna HERO, [place] 08/16/24 2154

## 2024-08-10 NOTE — ED Notes (Signed)
 Patient is being discharged from the Urgent Care and sent to the Emergency Department via [facility] . Per Rosaline Silk, NP, patient is in need of higher level of care due to sepsis/tachnypea. Patient is aware and verbalizes understanding of plan of care.  Vitals:   08/10/24 1858 08/10/24 1903  BP: (!) 111/57   Pulse:    Resp: (!) 24   Temp:  97.8 F (36.6 C)  SpO2:

## 2024-08-10 NOTE — ED Provider Notes (Signed)
 [place] [facility] AT [facility] Provider Note   CSN: [id number] Arrival date & time: 08/10/24  2025     Patient presents with: Shortness of Breath and Chest Pain   Leonard Sweeney is a 53 y.o. male.   The history is provided by the patient and medical records.  Shortness of Breath Associated symptoms: chest pain, cough and fever   Chest Pain Associated symptoms: cough, fever and shortness of breath    53 year old male with no significant past medical history presenting to the ED from local urgent care due to cough, fever, shortness of breath, chest discomfort ongoing for the last 8 to 9 days.  He denies any sick contacts with similar.  States he has continued to get worse to the point where he was really struggling to breathe today.  He was noted to be saturating in the 80s at urgent care and placed on supplemental O2.  He is not generally oxygen dependent.  Cough is mostly been dry, intermittently productive with discolored sputum.  States he has been eating and drinking, has had some sporadic vomiting, sounds more like posttussive emesis.  No meds taken PTA.  Ongoing cocaine use, last use was yesterday.  Prior to Admission medications   Medication Sig Start Date End Date Taking? Authorizing Provider  acetaminophen  (TYLENOL ) 325 MG tablet Take 2 tablets (650 mg total) by mouth every 6 (six) hours as needed. 05/08/23   Elnor Jayson LABOR, DO  albuterol  (VENTOLIN  HFA) 108 (90 Base) MCG/ACT inhaler Inhale 1-2 puffs into the lungs every 6 (six) hours as needed for up to 5 days for wheezing or shortness of breath. 03/07/23 03/12/23  Gowens, Mariah L, PA-C  cyclobenzaprine  (FLEXERIL ) 10 MG tablet Take 1 tablet (10 mg total) by mouth 2 (two) times daily as needed for muscle spasms. 05/08/23   Elnor Jayson LABOR, DO  ibuprofen  (ADVIL ) 600 MG tablet Take 1 tablet (600 mg total) by mouth every 6 (six) hours as needed. 05/08/23   Elnor Jayson LABOR, DO  oxyCODONE  (ROXICODONE ) 5 MG immediate  release tablet Take 1 tablet (5 mg total) by mouth every 6 (six) hours as needed for severe pain. 05/08/23   Elnor Jayson LABOR, DO  promethazine -dextromethorphan (PROMETHAZINE -DM) 6.25-15 MG/5ML syrup Take 5 mLs by mouth 2 (two) times daily as needed for cough. Patient not taking: Reported on 05/08/2023 09/17/22   Raspet, Erin K, PA-C    Allergies: Patient has no known allergies.    Review of Systems  Constitutional:  Positive for chills and fever.  Respiratory:  Positive for cough and shortness of breath.   Cardiovascular:  Positive for chest pain.  All other systems reviewed and are negative.   Updated Vital Signs BP 129/68 (BP Location: Left Arm)   Pulse 88   Temp 98 F (36.7 C) (Oral)   Resp (!) 22   Ht 5' 11 (1.803 m)   Wt 108.9 kg   SpO2 99%   BMI 33.47 kg/m   Physical Exam Vitals and nursing note reviewed.  Constitutional:      Appearance: He is well-developed. He is ill-appearing.     Comments: Clammy to the touch  HENT:     Head: Normocephalic and atraumatic.  Eyes:     Conjunctiva/sclera: Conjunctivae normal.     Pupils: Pupils are equal, round, and reactive to light.  Cardiovascular:     Rate and Rhythm: Normal rate and regular rhythm.     Heart sounds: Normal heart sounds.  Pulmonary:  Effort: Pulmonary effort is normal. Tachypnea present.     Breath sounds: Rhonchi present.     Comments: Tachypneic, scattered rhonchi, sats 100% on supplemental 2L, continuous dry coughing during exam Abdominal:     General: Bowel sounds are normal.     Palpations: Abdomen is soft.  Musculoskeletal:        General: Normal range of motion.     Cervical back: Normal range of motion.  Skin:    General: Skin is warm and dry.  Neurological:     Mental Status: He is alert and oriented to person, place, and time.     (all labs ordered are listed, but only abnormal results are displayed) Labs Reviewed  COMPREHENSIVE METABOLIC PANEL WITH GFR - Abnormal; Notable for the  following components:      Result Value   Sodium 134 (*)    Glucose, Bld 111 (*)    Calcium 8.1 (*)    Albumin 2.6 (*)    AST 56 (*)    All other components within normal limits  CBC WITH DIFFERENTIAL/PLATELET - Abnormal; Notable for the following components:   RBC 3.75 (*)    Hemoglobin 11.5 (*)    HCT 34.3 (*)    Monocytes Absolute 1.1 (*)    All other components within normal limits  TROPONIN I (HIGH SENSITIVITY) - Abnormal; Notable for the following components:   Troponin I (High Sensitivity) 36 (*)    All other components within normal limits  RESP PANEL BY RT-PCR (RSV, FLU A&B, COVID)  [id number]  CULTURE, BLOOD (ROUTINE X 2)  CULTURE, BLOOD (ROUTINE X 2)  RAPID URINE DRUG SCREEN, HOSP PERFORMED  I-STAT CG4 LACTIC ACID, ED  TROPONIN I (HIGH SENSITIVITY)    EKG: None  Radiology: DG Chest Portable 1 View Result Date: 08/10/2024 CLINICAL DATA:  Chest pain EXAM: PORTABLE CHEST 1 VIEW COMPARISON:  03/07/2023 FINDINGS: Right lung grossly clear. Mild cardiomegaly. Patchy airspace opacities in the left mid to lower lung. No pneumothorax IMPRESSION: Patchy airspace opacities in the left mid to lower lung, possible pneumonia. Imaging follow-up to resolution is recommended. Electronically Signed   By: Luke Bun M.D.   On: 08/10/2024 21:21     Procedures   CRITICAL CARE Performed by: Olam CHRISTELLA Slocumb   Total critical care time: 45 minutes  Critical care time was exclusive of separately billable procedures and treating other patients.  Critical care was necessary to treat or prevent imminent or life-threatening deterioration.  Critical care was time spent personally by me on the following activities: development of treatment plan with patient and/or surrogate as well as nursing, discussions with consultants, evaluation of patient's response to treatment, examination of patient, obtaining history from patient or surrogate, ordering and performing treatments and interventions,  ordering and review of laboratory studies, ordering and review of radiographic studies, pulse oximetry and re-evaluation of patient's condition.   Medications Ordered in the ED  azithromycin (ZITHROMAX) 500 mg in sodium chloride  0.9 % 250 mL IVPB (has no administration in time range)  cefTRIAXone  (ROCEPHIN ) 1 g in sodium chloride  0.9 % 100 mL IVPB (1 g Intravenous New Bag/Given 08/10/24 2351)  acetaminophen  (TYLENOL ) tablet 650 mg (has no administration in time range)    Or  acetaminophen  (TYLENOL ) suppository 650 mg (has no administration in time range)  melatonin tablet 3 mg (has no administration in time range)  ondansetron  (ZOFRAN ) injection 4 mg (has no administration in time range)  azithromycin (ZITHROMAX) 500 mg in sodium chloride  0.9 %  250 mL IVPB (has no administration in time range)  cefTRIAXone  (ROCEPHIN ) 1 g in sodium chloride  0.9 % 100 mL IVPB (has no administration in time range)  benzonatate  (TESSALON ) capsule 200 mg (has no administration in time range)  guaiFENesin (MUCINEX) 12 hr tablet 600 mg (600 mg Oral Given 08/10/24 2343)  menthol (CEPACOL) lozenge 3 mg (has no administration in time range)                                    Medical Decision Making Amount and/or Complexity of Data Reviewed Labs: ordered. Radiology: ordered and independent interpretation performed. ECG/medicine tests: ordered and independent interpretation performed.  Risk Decision regarding hospitalization.   53 y.o. M transferred here from UC due to cough and SOB.  Symptoms ongoing x9 days or so.  No sick contacts with similar.  Found to be hypoxic at UC, generally not O2 dependent.  He is unwell appearing, clammy, has tachypnea, but VSS on supplemental 2L.  He does have some scattered rhonchi on exam.  Cough is dry and non-productive.  Work-up initiated in triage and reviewed-- normal laccate and WBC count.  No significant electrolyte derangement.  Troponin is 36.  Last we have on file is  from several years ago and were negative.  Does admit to ongoing cocaine use, last use was yesterday.  EKG is non-ischemic.  Chest pain seems related to his coughing.  Chest x-ray with findings of left-sided pneumonia.  RVP is negative.  Given workup findings as well as new oxygen requirement, he will require admission.  Have ordered blood cultures as well as Rocephin  and azithromycin for CAP coverage.  Discussed with hospitalist, Dr. Marcene-- will admit for ongoing care.  Final diagnoses:  Community acquired pneumonia of left lower lobe of lung  Hypoxia    ED Discharge Orders     None          Jarold Olam HERO, PA-C 08/11/24 0018    Lorette Mayo, MD 08/11/24 [phone]

## 2024-08-10 NOTE — ED Notes (Signed)
 Patient frequently answers he doesn't remember or doesn't know the answer to a lot of questions being asked

## 2024-08-10 NOTE — H&P (Signed)
 History and Physical      Leonard Sweeney FMW:[id number] DOB: 22-Aug-1971 DOA: 08/10/2024; DOS: 08/10/2024  PCP: Pcp, No (will further assess) Patient coming from: home   I have personally briefly reviewed patient's old medical records in [facility]  Chief Complaint: Shortness of breath  HPI: Leonard Sweeney is a 53 y.o. male with medical history significant for cocaine abuse, who is admitted to [facility] on 08/10/2024 with severe sepsis due to suspected community-acquired pneumonia after presenting from home to [facility] ED complaining of shortness of breath.   The patient reports 1 week of progressive shortness of breath associated with new onset cough, subjective fever, as well as left-sided chest discomfort that is noted at times of cough or deep inspiration, while denying any associated hemoptysis.  Chest pain is not exertional.   Denies any associated headache, neck stiffness, abdominal pain, dysuria or gross hematuria.  He acknowledges recurrent cocaine use, and conveys that most recent cocaine use occurred on 08/09/2024.  Denies any known history of chronic underlying pulmonary pathology, denies any known history of baseline supplemental oxygen requirements.     ED Course:  Vital signs in the ED were notable for the following: Temperature max 21.5; heart rates in the 80s to 1 teens; systolic pressures in the 1 teens 120s; respiratory rate 22-27, initial oxygen saturation reported to be in the high 80s on room air, Sosan improving into the range of 94 to 99% on 2 L nasal cannula.  Labs were notable for the following: CMP notable for the following: Bicarbonate 24, creatinine 1.17, glucose 111, calcium adjusted for mild hypoalbuminemia 9.3, avidin 0.6, AST 56, otherwise liver enzymes are within normal limits.  High sensitive troponin I was initially 36, with repeat value trending up slightly to 43.  Lactic acid 1.1.  CBC notable for leukocytosis to 800.  Urinary drug screen was  positive for cocaine, otherwise pan negative.  Blood cultures x 2 were collected prior to initiation of IV antibiotics.  COVID, influenza and RSV PCR were all negative.  Per my interpretation, EKG in ED demonstrated the following: Comparison to most recent prior EKG performed on 03/09/2023, today's EKG shows sinus rhythm with a rate 88, normal intervals, T wave inversion in leads I, aVL, V5, V6, lead II, all of which appear unchanged from most recent prior EKG, demonstrating no evidence of new T wave changes and no evidence of ST changes, including no evidence of ST elevation.  Imaging in the ED, per corresponding formal radiology read, was notable for the following: 1 view chest x-ray showed evidence of airspace opacity in the left mid to lower lung, suggestive of pneumonia, in the absence of any evidence of edema, effusion, or pneumothorax.  While in the ED, the following were administered: Azithromycin and Rocephin .  Subsequently, the patient was admitted for further evaluation management of severe sepsis due to suspected community-acquired pneumonia complicated by acute hypoxic respiratory distress.     Review of Systems: As per HPI otherwise 10 point review of systems negative.   Past Medical History:  Diagnosis Date   Anxiety    Depression     History reviewed. No pertinent surgical history.  Social History:  reports that he has never smoked. He has never used smokeless tobacco. He reports that he does not currently use alcohol. He reports current drug use. Drug: Cocaine.   No Known Allergies  History reviewed. No pertinent family history.    Prior to Admission medications   Medication Sig Start  Date End Date Taking? Authorizing Provider  acetaminophen  (TYLENOL ) 325 MG tablet Take 2 tablets (650 mg total) by mouth every 6 (six) hours as needed. 05/08/23   Elnor Jayson LABOR, DO  albuterol  (VENTOLIN  HFA) 108 (90 Base) MCG/ACT inhaler Inhale 1-2 puffs into the lungs every 6 (six)  hours as needed for up to 5 days for wheezing or shortness of breath. 03/07/23 03/12/23  Gowens, Mariah L, PA-C  cyclobenzaprine  (FLEXERIL ) 10 MG tablet Take 1 tablet (10 mg total) by mouth 2 (two) times daily as needed for muscle spasms. 05/08/23   Elnor Jayson LABOR, DO  ibuprofen  (ADVIL ) 600 MG tablet Take 1 tablet (600 mg total) by mouth every 6 (six) hours as needed. 05/08/23   Elnor Jayson LABOR, DO  oxyCODONE  (ROXICODONE ) 5 MG immediate release tablet Take 1 tablet (5 mg total) by mouth every 6 (six) hours as needed for severe pain. 05/08/23   Elnor Jayson LABOR, DO  promethazine -dextromethorphan (PROMETHAZINE -DM) 6.25-15 MG/5ML syrup Take 5 mLs by mouth 2 (two) times daily as needed for cough. Patient not taking: Reported on 05/08/2023 09/17/22   Sherrell Rocky POUR, PA-C     Objective    Physical Exam: Vitals:   08/10/24 2029 08/10/24 2032  BP: 129/68   Pulse: 88   Resp: (!) 22   Temp: 98 F (36.7 C)   TempSrc: Oral   SpO2: 99%   Weight:  108.9 kg  Height:  5' 11 (1.803 m)    General: appears to be stated age; alert, oriented; mildly increased work of breathing noted Skin: warm, dry, no rash Head:  AT/[place] Mouth:  Oral mucosa membranes appear moist, normal dentition Neck: supple; trachea midline Heart: Mildly tachycardic, but regular; did not appreciate any M/R/G Lungs: CTAB, did not appreciate any wheezes, rales, or rhonchi Abdomen: + BS; soft, ND, NT Vascular: 2+ pedal pulses b/l; 2+ radial pulses b/l Extremities: no peripheral edema, no muscle wasting        Labs on Admission: I have personally reviewed following labs and imaging studies  CBC: Recent Labs  Lab 08/10/24 2104  WBC 6.8  NEUTROABS 4.3  HGB 11.5*  HCT 34.3*  MCV 91.5  PLT 195   Basic Metabolic Panel: Recent Labs  Lab 08/10/24 2104  NA 134*  K 3.5  CL 100  CO2 24  GLUCOSE 111*  BUN 11  CREATININE 1.17  CALCIUM 8.1*   GFR: Estimated Creatinine Clearance: 89.5 mL/min (by C-G formula based on SCr of 1.17  mg/dL). Liver Function Tests: Recent Labs  Lab 08/10/24 2104  AST 56*  ALT 35  ALKPHOS 55  BILITOT 0.8  PROT 7.0  ALBUMIN 2.6*   No results for input(s): LIPASE, AMYLASE in the last 168 hours. No results for input(s): AMMONIA in the last 168 hours. Coagulation Profile: No results for input(s): INR, PROTIME in the last 168 hours. Cardiac Enzymes: No results for input(s): CKTOTAL, CKMB, CKMBINDEX, TROPONINI in the last 168 hours. BNP (last 3 results) No results for input(s): PROBNP in the last 8760 hours. HbA1C: No results for input(s): HGBA1C in the last 72 hours. CBG: No results for input(s): GLUCAP in the last 168 hours. Lipid Profile: No results for input(s): CHOL, HDL, LDLCALC, TRIG, CHOLHDL, LDLDIRECT in the last 72 hours. Thyroid  Function Tests: No results for input(s): TSH, T4TOTAL, FREET4, T3FREE, THYROIDAB in the last 72 hours. Anemia Panel: No results for input(s): VITAMINB12, FOLATE, FERRITIN, TIBC, IRON, RETICCTPCT in the last 72 hours. Urine analysis:    Component  Value Date/Time   COLORURINE YELLOW 11/27/2019 1625   APPEARANCEUR CLEAR 11/27/2019 1625   LABSPEC 1.014 11/27/2019 1625   PHURINE 5.0 11/27/2019 1625   GLUCOSEU NEGATIVE 11/27/2019 1625   HGBUR SMALL (A) 11/27/2019 1625   BILIRUBINUR NEGATIVE 11/27/2019 1625   KETONESUR NEGATIVE 11/27/2019 1625   PROTEINUR NEGATIVE 11/27/2019 1625   NITRITE NEGATIVE 11/27/2019 1625   LEUKOCYTESUR NEGATIVE 11/27/2019 1625    Radiological Exams on Admission: DG Chest Portable 1 View Result Date: 08/10/2024 CLINICAL DATA:  Chest pain EXAM: PORTABLE CHEST 1 VIEW COMPARISON:  03/07/2023 FINDINGS: Right lung grossly clear. Mild cardiomegaly. Patchy airspace opacities in the left mid to lower lung. No pneumothorax IMPRESSION: Patchy airspace opacities in the left mid to lower lung, possible pneumonia. Imaging follow-up to resolution is recommended.  Electronically Signed   By: Luke Bun M.D.   On: 08/10/2024 21:21      Assessment/Plan   Principal Problem:   CAP (community acquired pneumonia) Active Problems:   Severe sepsis (HCC)   SOB (shortness of breath)   Elevated troponin   Acute hypoxic respiratory failure (HCC)   Cocaine abuse (HCC)      #) Severe sepsis due to commune acquired pna: Diagnosis on the basis of 1 week of progressive shortness of breath associated in onset cough, objective fever, pleuritic left-sided chest discomfort, with chest x-ray showing evidence of left-sided airspace opacity concerning for pneumonia, consistent with distribution of patient's reported pleuritic left-sided chest discomfort.  SIRS criteria met via objective fever with temperature max 101.5 in the ED, tachycardia, tachypnea. Lactic acid level: 1.1. Of note, given the associated presence of suspected end organ damage in the form of concominant presenting acute hypoxic respiratory distress, criteria are met for pt's sepsis to be considered severe in nature. However, in the absence of lactic acid level that is greater than or equal to 4.0, and in the absence of any associated hypotension refractory to IVF's, there are no indications for administration of a 30 mL/kg IVF bolus at this time.   Additional ED work-up/management notable for: Blood cultures x 2 were collected prior to initiation of azithromycin and Rocephin .  No e/o additional infectious process at this time, including negative COVID, influenza, RSV PCR.  Plan: CBC w/ diff and CMP in AM.  Follow for results of blood cx's x 2. Abx: Continue azithromycin, Rocephin .  Add on procalcitonin level.  Strep pneumo urine antigen.  Check urinalysis.  Flutter valve, incentive [place], prn Tessalon  Perles, scheduled guaifenesin.                      #) Acute hypoxic respiratory distress: in the context of acute respiratory symptoms and no known baseline supplemental O2  requirements, presenting O2 sat was reported to be in the high 80s on room air, Sosan improving into the mid 90s on 2 L nasal cannula, thereby meeting criteria for acute hypoxic respiratory distress as opposed to acute hypoxic respiratory failure at this time. Appears to be on basis of community-acquired pneumonia, with chest x-ray. Presenting CXR shows left side airspace opacity concerning for pneumonia, but otherwise demonstrating no evidence of acute cardiopulmonary process.  No known chronic underlying pulmonary conditions showing.  Suspect that mildly elevated troponin is on the basis of supply demand mismatch resulting from the decrease in oxygen delivery capacity that is a/w presenting acute hypoxic respiratory distress as opposed to representing a type I process due to acute plaque rupture, particularly given presenting ekg that shows no e/o  acute ischemic changes relative to most recent prior EKG from June 2024, including no evidence of STEMI, and the absence of any reported exertional chest pain a/w this presentation.  Additionally, patient's acknowledgment of cocaine use yesterday can also be contributory to his elevated troponin level.  Overall, ACS is felt to be less likely relative to type 2 supply demand mismatch.  Less likely to be representative of acute pulmonary embolism, however, in the context of acute Evoxac respiratory distress, pleuritic chest discomfort, tachycardia, will check D-dimer, which, if positive, will pursue CTA chest with PE protocol.   No clinical or radiographic evidence to suggest acutely decompensated heart failure, although he is at risk for development of such in the context of his cocaine abuse.  COVID-19/Influenza PCR are negative.   Plan: further evaluation/management of presenting severe sepsis due to commune acquired pneumonia, as above. monitor on telemetry. CMP/CBC in the AM. Check serum Mg and Phos levels. Flutter valve, incentive spirometry.  Check D-dimer,  BNP, repeat troponin in the morning, echocardiogram.  Add on procalcitonin level.                           #) cocaine abuse: Patient conveys recurrent use of cocaine, noting most recent use of cocaine yesterday, 08/09/2024, which is consistent with his urinary drug screen result, which shows a positive finding for cocaine.   Plan: Refrain from use of selective beta-2 blockers.  Monitor on telemetry.  Echocardiogram.  Check BNP.  Counseled the patient on the importance of discontinuation of use of cocaine.  Also place consult with transition of care team.       DVT prophylaxis: SCD's   Code Status: Full code Family Communication: none Disposition Plan: Per Rounding Team Consults called: none;  Admission status: inpatient     I SPENT GREATER THAN 75  MINUTES IN CLINICAL CARE TIME/MEDICAL DECISION-MAKING IN COMPLETING THIS ADMISSION.      Eva NOVAK Markiya Keefe DO [facility]  From 7PM - 7AM   08/10/2024, 11:35 PM

## 2024-08-10 NOTE — ED Triage Notes (Signed)
 Patient has a cough, patient appears unwell.  Tachypnea walking to treatment room.  Reports coughing episodes followed by vomiting .  Symptoms for over a week.  SABRA

## 2024-08-10 NOTE — ED Notes (Signed)
 Resp 32 when walking from lobby

## 2024-08-10 NOTE — ED Notes (Signed)
 Transported by [facility] to ED

## 2024-08-11 ENCOUNTER — Inpatient Hospital Stay (HOSPITAL_COMMUNITY): Payer: Self-pay

## 2024-08-11 ENCOUNTER — Encounter (HOSPITAL_COMMUNITY): Payer: Self-pay | Admitting: Internal Medicine

## 2024-08-11 DIAGNOSIS — R079 Chest pain, unspecified: Secondary | ICD-10-CM

## 2024-08-11 DIAGNOSIS — R7989 Other specified abnormal findings of blood chemistry: Secondary | ICD-10-CM | POA: Diagnosis present

## 2024-08-11 DIAGNOSIS — F1410 Cocaine abuse, uncomplicated: Secondary | ICD-10-CM | POA: Diagnosis present

## 2024-08-11 DIAGNOSIS — J9601 Acute respiratory failure with hypoxia: Secondary | ICD-10-CM | POA: Diagnosis present

## 2024-08-11 DIAGNOSIS — A419 Sepsis, unspecified organism: Secondary | ICD-10-CM | POA: Diagnosis present

## 2024-08-11 DIAGNOSIS — R0602 Shortness of breath: Secondary | ICD-10-CM | POA: Diagnosis present

## 2024-08-11 LAB — CBC WITH DIFFERENTIAL/PLATELET
Abs Immature Granulocytes: 0.03 K/uL (ref 0.00–0.07)
Basophils Absolute: 0 K/uL (ref 0.0–0.1)
Basophils Relative: 0 %
Eosinophils Absolute: 0.1 K/uL (ref 0.0–0.5)
Eosinophils Relative: 2 %
HCT: 34 % — ABNORMAL LOW (ref 39.0–52.0)
Hemoglobin: 11.5 g/dL — ABNORMAL LOW (ref 13.0–17.0)
Immature Granulocytes: 1 %
Lymphocytes Relative: 16 %
Lymphs Abs: 0.9 K/uL (ref 0.7–4.0)
MCH: 31.3 pg (ref 26.0–34.0)
MCHC: 33.8 g/dL (ref 30.0–36.0)
MCV: 92.4 fL (ref 80.0–100.0)
Monocytes Absolute: 1 K/uL (ref 0.1–1.0)
Monocytes Relative: 17 %
Neutro Abs: 3.8 K/uL (ref 1.7–7.7)
Neutrophils Relative %: 64 %
Platelets: 201 K/uL (ref 150–400)
RBC: 3.68 MIL/uL — ABNORMAL LOW (ref 4.22–5.81)
RDW: 13.4 % (ref 11.5–15.5)
WBC: 5.9 K/uL (ref 4.0–10.5)
nRBC: 0 % (ref 0.0–0.2)

## 2024-08-11 LAB — URINALYSIS, COMPLETE (UACMP) WITH MICROSCOPIC
Bacteria, UA: NONE SEEN
Bilirubin Urine: NEGATIVE
Glucose, UA: NEGATIVE mg/dL
Ketones, ur: NEGATIVE mg/dL
Leukocytes,Ua: NEGATIVE
Nitrite: NEGATIVE
Protein, ur: 30 mg/dL — AB
Specific Gravity, Urine: >1.046 — ABNORMAL HIGH (ref 1.005–1.030)
pH: 7 (ref 5.0–8.0)

## 2024-08-11 LAB — ECHOCARDIOGRAM COMPLETE
AR max vel: 2.9 cm2
AV Area VTI: 3.41 cm2
AV Area mean vel: 3.6 cm2
AV Mean grad: 4 mmHg
AV Peak grad: 8.1 mmHg
Ao pk vel: 1.42 m/s
Area-P 1/2: 4.71 cm2
Height: 71 in
S' Lateral: 4 cm
Weight: 3840 [oz_av]

## 2024-08-11 LAB — PROCALCITONIN: Procalcitonin: 0.25 ng/mL

## 2024-08-11 LAB — COMPREHENSIVE METABOLIC PANEL WITH GFR
ALT: 33 U/L (ref 0–44)
AST: 52 U/L — ABNORMAL HIGH (ref 15–41)
Albumin: 2.5 g/dL — ABNORMAL LOW (ref 3.5–5.0)
Alkaline Phosphatase: 53 U/L (ref 38–126)
Anion gap: 13 (ref 5–15)
BUN: 10 mg/dL (ref 6–20)
CO2: 23 mmol/L (ref 22–32)
Calcium: 8.6 mg/dL — ABNORMAL LOW (ref 8.9–10.3)
Chloride: 100 mmol/L (ref 98–111)
Creatinine, Ser: 1.18 mg/dL (ref 0.61–1.24)
GFR, Estimated: >60 mL/min (ref >60)
Glucose, Bld: 101 mg/dL — ABNORMAL HIGH (ref 70–99)
Potassium: 3.7 mmol/L (ref 3.5–5.1)
Sodium: 136 mmol/L (ref 135–145)
Total Bilirubin: 0.7 mg/dL (ref 0.0–1.2)
Total Protein: 6.7 g/dL (ref 6.5–8.1)

## 2024-08-11 LAB — RAPID URINE DRUG SCREEN, HOSP PERFORMED
Amphetamines: NOT DETECTED
Barbiturates: NOT DETECTED
Benzodiazepines: NOT DETECTED
Cocaine: POSITIVE — AB
Opiates: NOT DETECTED
Tetrahydrocannabinol: NOT DETECTED

## 2024-08-11 LAB — BRAIN NATRIURETIC PEPTIDE: B Natriuretic Peptide: 85.9 pg/mL (ref 0.0–100.0)

## 2024-08-11 LAB — TROPONIN I (HIGH SENSITIVITY)
Troponin I (High Sensitivity): 43 ng/L — ABNORMAL HIGH (ref <18)
Troponin I (High Sensitivity): 35 ng/L — ABNORMAL HIGH (ref <18)

## 2024-08-11 LAB — MAGNESIUM
Magnesium: 2 mg/dL (ref 1.7–2.4)
Magnesium: 2 mg/dL (ref 1.7–2.4)

## 2024-08-11 LAB — PHOSPHORUS: Phosphorus: 3.8 mg/dL (ref 2.5–4.6)

## 2024-08-11 LAB — D-DIMER, QUANTITATIVE: D-Dimer, Quant: 3.9 ug{FEU}/mL — ABNORMAL HIGH (ref 0.00–0.50)

## 2024-08-11 LAB — STREP PNEUMONIAE URINARY ANTIGEN: Strep Pneumo Urinary Antigen: NEGATIVE

## 2024-08-11 MED ORDER — IOHEXOL 350 MG/ML SOLN
75.0000 mL | Freq: Once | INTRAVENOUS | Status: AC | PRN
  Administered 2024-08-11: 75 mL via INTRAVENOUS

## 2024-08-11 MED ORDER — ENSURE PLUS HIGH PROTEIN PO LIQD
237.0000 mL | Freq: Two times a day (BID) | ORAL | Status: DC
  Administered 2024-08-12 – 2024-08-15 (×6): 237 mL via ORAL

## 2024-08-11 MED ORDER — GUAIFENESIN-DM 100-10 MG/5ML PO SYRP
10.0000 mL | ORAL_SOLUTION | ORAL | Status: DC | PRN
  Administered 2024-08-11 – 2024-08-14 (×4): 10 mL via ORAL
  Filled 2024-08-11 (×4): qty 10

## 2024-08-11 NOTE — ED Notes (Signed)
Echo at BS 

## 2024-08-11 NOTE — Plan of Care (Signed)

## 2024-08-11 NOTE — Progress Notes (Signed)
 [facility]                                                                               Pete Schnitzer, is a 53 y.o. male, DOB - 20-Oct-1970, FMW:[id number] Admit date - 08/10/2024    Outpatient Primary MD for the patient is Pcp, No  LOS - 1  days    Brief summary   Leonard Sweeney is a 53 y.o. male with medical history significant for cocaine abuse, who is admitted to [facility] on 08/10/2024 with severe sepsis due to suspected community-acquired pneumonia after presenting from home to [facility] ED complaining of shortness of breath.   He acknowledges recurrent cocaine use, and conveys that most recent cocaine use occurred on 08/09/2024.    Assessment & Plan    SIRS from community-acquired pneumonia Patient was febrile tachycardic, tachypneic and hypoxic on arrival to ED Chest x-ray showed possible left middle and lower lobe pneumonia Patient was started on IV ceftriaxone  and azithromycin. Nasal cannula oxygen to keep sats greater than 90% Follow-up blood cultures and obtain sputum cultures if possible Urine for strep pneumonia and Legionella pneumonia ordered.   Elevated D-dimer, mildly elevated troponins Obtain CTA chest to rule out PE    Cocaine abuse/substance abuse Counseled on cessation TOC will be consulted for resources    Mildly elevated troponins probably from demand ischemia from acute respiratory failure with hypoxia from CAP EKG showing sinus rhythm, left ventricular hypertrophy, inferolateral T wave inversions. Echocardiogram ordered and pending.      Estimated body mass index is 33.47 kg/m as calculated from the following:   Height as of this encounter: 5' 11 (1.803 m).   Weight as of this encounter: 108.9 kg.  Code Status: full code DVT Prophylaxis:  SCDs Start: 08/10/24 2333   Level of Care: Level of care: Progressive Family Communication: none at bedside.   Disposition Plan:     Remains inpatient appropriate:  pending  clinical improvement.   Procedures:  None.   Consultants:   none  Antimicrobials:   Anti-infectives (From admission, onward)    Start     Dose/Rate Route Frequency Ordered Stop   08/11/24 2200  azithromycin (ZITHROMAX) 500 mg in sodium chloride  0.9 % 250 mL IVPB        500 mg 250 mL/hr over 60 Minutes Intravenous Every 24 hours 08/10/24 2333     08/11/24 2200  cefTRIAXone  (ROCEPHIN ) 1 g in sodium chloride  0.9 % 100 mL IVPB        1 g 200 mL/hr over 30 Minutes Intravenous Every 24 hours 08/10/24 2333     08/10/24 2245  azithromycin (ZITHROMAX) 500 mg in sodium chloride  0.9 % 250 mL IVPB        500 mg 250 mL/hr over 60 Minutes Intravenous  Once 08/10/24 2242 08/11/24 0210   08/10/24 2245  cefTRIAXone  (ROCEPHIN ) 1 g in sodium chloride  0.9 % 100 mL IVPB        1 g 200 mL/hr over 30 Minutes Intravenous  Once 08/10/24 2242 08/11/24 0026        Medications  Scheduled Meds:  guaiFENesin  600 mg Oral BID   Continuous Infusions:  azithromycin     cefTRIAXone  (ROCEPHIN )  IV     PRN Meds:.acetaminophen  **OR** acetaminophen , benzonatate , melatonin, menthol, ondansetron  (ZOFRAN ) IV    Subjective:   Leonard Sweeney was seen and examined today.  Sob, chest pain on deep breathing and coughing .   Objective:   Vitals:   08/11/24 0355 08/11/24 0600 08/11/24 0730 08/11/24 0745  BP:  131/78 132/77   Pulse:  78 84 86  Resp:  (!) 29 (!) 22 (!) 4  Temp: 98.3 F (36.8 C)     TempSrc: Oral     SpO2:  99% 99% 92%  Weight:      Height:        Intake/Output Summary (Last 24 hours) at 08/11/2024 0917 Last data filed at 08/11/2024 0210 Gross per 24 hour  Intake 451.77 ml  Output --  Net 451.77 ml   Filed Weights   08/10/24 2032  Weight: 108.9 kg     Exam General exam: Appears calm and comfortable  Respiratory system: Clear to auscultation. Respiratory effort normal. Cardiovascular system: S1 & S2 heard, RRR. No JVD, murmurs, rubs, gallops or clicks. No pedal  edema. Gastrointestinal system: Abdomen is soft bs+ Central nervous system: Alert and oriented. No focal neurological deficits. Extremities: Symmetric 5 x 5 power. Skin: No rashes, lesions or ulcers Psychiatry:  Mood & affect appropriate.     Data Reviewed:  I have personally reviewed following labs and imaging studies   CBC Lab Results  Component Value Date   WBC 5.9 08/11/2024   RBC 3.68 (L) 08/11/2024   HGB 11.5 (L) 08/11/2024   HCT 34.0 (L) 08/11/2024   MCV 92.4 08/11/2024   MCH 31.3 08/11/2024   PLT 201 08/11/2024   MCHC 33.8 08/11/2024   RDW 13.4 08/11/2024   LYMPHSABS 0.9 08/11/2024   MONOABS 1.0 08/11/2024   EOSABS 0.1 08/11/2024   BASOSABS 0.0 08/11/2024     Last metabolic panel Lab Results  Component Value Date   NA 136 08/11/2024   K 3.7 08/11/2024   CL 100 08/11/2024   CO2 23 08/11/2024   BUN 10 08/11/2024   CREATININE 1.18 08/11/2024   GLUCOSE 101 (H) 08/11/2024   GFRNONAA >60 08/11/2024   GFRAA >60 11/29/2019   CALCIUM 8.6 (L) 08/11/2024   PHOS 3.8 08/11/2024   PROT 6.7 08/11/2024   ALBUMIN 2.5 (L) 08/11/2024   BILITOT 0.7 08/11/2024   ALKPHOS 53 08/11/2024   AST 52 (H) 08/11/2024   ALT 33 08/11/2024   ANIONGAP 13 08/11/2024    CBG (last 3)  No results for input(s): GLUCAP in the last 72 hours.    Coagulation Profile: No results for input(s): INR, PROTIME in the last 168 hours.   Radiology Studies: DG Chest Portable 1 View Result Date: 08/10/2024 CLINICAL DATA:  Chest pain EXAM: PORTABLE CHEST 1 VIEW COMPARISON:  03/07/2023 FINDINGS: Right lung grossly clear. Mild cardiomegaly. Patchy airspace opacities in the left mid to lower lung. No pneumothorax IMPRESSION: Patchy airspace opacities in the left mid to lower lung, possible pneumonia. Imaging follow-up to resolution is recommended. Electronically Signed   By: Luke Bun M.D.   On: 08/10/2024 21:21       Elgie Butter M.D. [facility] 08/11/2024, 9:17  AM  Available via Epic secure chat 7am-7pm After 7 pm, please refer to night coverage provider listed on amion.

## 2024-08-11 NOTE — ED Notes (Signed)
 Up to b/r. Appears winded. Denies pain. Mentions discomfort 4/10. States, I'm fine. Steady gait.

## 2024-08-12 LAB — BASIC METABOLIC PANEL WITH GFR
Anion gap: 12 (ref 5–15)
BUN: 8 mg/dL (ref 6–20)
CO2: 24 mmol/L (ref 22–32)
Calcium: 8.6 mg/dL — ABNORMAL LOW (ref 8.9–10.3)
Chloride: 101 mmol/L (ref 98–111)
Creatinine, Ser: 1.35 mg/dL — ABNORMAL HIGH (ref 0.61–1.24)
GFR, Estimated: >60 mL/min (ref >60)
Glucose, Bld: 116 mg/dL — ABNORMAL HIGH (ref 70–99)
Potassium: 4 mmol/L (ref 3.5–5.1)
Sodium: 137 mmol/L (ref 135–145)

## 2024-08-12 LAB — LEGIONELLA PNEUMOPHILA SEROGP 1 UR AG: L. pneumophila Serogp 1 Ur Ag: NEGATIVE

## 2024-08-12 LAB — CK: Total CK: 640 U/L — ABNORMAL HIGH (ref 49–397)

## 2024-08-12 MED ORDER — IPRATROPIUM-ALBUTEROL 0.5-2.5 (3) MG/3ML IN SOLN: 3.0000 mL | Freq: Four times a day (QID) | RESPIRATORY_TRACT | Status: DC | PRN

## 2024-08-12 MED ORDER — HYDRALAZINE HCL 25 MG PO TABS
25.0000 mg | ORAL_TABLET | Freq: Four times a day (QID) | ORAL | Status: DC | PRN
  Filled 2024-08-12: qty 1

## 2024-08-12 MED ORDER — SODIUM CHLORIDE 0.9 % IV SOLN
3.0000 g | Freq: Three times a day (TID) | INTRAVENOUS | Status: DC
  Administered 2024-08-12 – 2024-08-15 (×10): 3 g via INTRAVENOUS
  Filled 2024-08-12 (×8): qty 8

## 2024-08-12 MED ORDER — ASPIRIN 81 MG PO TBEC
81.0000 mg | DELAYED_RELEASE_TABLET | Freq: Every day | ORAL | Status: DC
  Administered 2024-08-12 – 2024-08-15 (×4): 81 mg via ORAL
  Filled 2024-08-12 (×4): qty 1

## 2024-08-12 MED ORDER — AZITHROMYCIN 500 MG PO TABS
500.0000 mg | ORAL_TABLET | Freq: Every day | ORAL | Status: DC
  Administered 2024-08-12 – 2024-08-13 (×2): 500 mg via ORAL
  Filled 2024-08-12 (×2): qty 1

## 2024-08-12 MED ORDER — BENZONATATE 100 MG PO CAPS
100.0000 mg | ORAL_CAPSULE | Freq: Three times a day (TID) | ORAL | Status: DC
  Administered 2024-08-12 – 2024-08-15 (×10): 100 mg via ORAL
  Filled 2024-08-12 (×11): qty 1

## 2024-08-12 MED ORDER — SODIUM CHLORIDE 0.9 % IV SOLN: INTRAVENOUS | Status: DC

## 2024-08-12 NOTE — Plan of Care (Signed)

## 2024-08-12 NOTE — Progress Notes (Signed)
 [facility]                                                                               Thai Burgueno, is a 53 y.o. adult, DOB - 1971-04-06, FMW:[id number] Admit date - 08/10/2024    Outpatient Primary MD for the patient is Pcp, No  LOS - 2  days    Brief summary   Leonard Sweeney is a 53 y.o. male with medical history significant for cocaine abuse, who is admitted to [facility] on 08/10/2024 with severe sepsis due to suspected community-acquired pneumonia after presenting from home to [facility] ED complaining of shortness of breath.   He acknowledges recurrent cocaine use, and conveys that most recent cocaine use occurred on 08/09/2024.    Assessment & Plan    Sepsis POA, due to community-acquired pneumonia Acute respiratory failure with hypoxia -This POA, tachypneic, tachycardic and hypoxic, as well fever 102.1. - On IV Rocephin  and azithromycin, remains febrile, will change to Unasyn. -obtain sputum culture, Legionella antigen and strep pneumonia antigen. - Follow on blood cultures, remain negative. - He was encouraged to use incentive spirometry and flutter valve. - CTA chest negative for PE  Elevated D-dimers -CTA chest negative for PE  Cocaine abuse/substance abuse Counseled on cessation TOC will be consulted for resources  Elevated troponins - Demand ischemia in the setting of hypoxia, and sepsis and cocaine use - The echo with mildly reduced EF 45 to 50% and regional wall motion abnormalities - No beta-blockers given cocaine use. - Will start on aspirin - Further workup can be pursued as an outpatient, will discuss with cardiology to arrange for outpatient follow-up.  AKI - Continue with IV fluids, avoid nephrotoxic medication - Check total CK due to recent cocaine use, especially with his UA significant for positive urine dipstick with negative red blood cells and concern for rhabdo     Estimated body mass index is 31.27 kg/m as calculated  from the following:   Height as of this encounter: 5' 11 (1.803 m).   Weight as of this encounter: 101.7 kg.  Code Status: full code DVT Prophylaxis:  SCDs Start: 08/10/24 2333   Level of Care: Level of care: Progressive Family Communication: none at bedside.   Disposition Plan:     Remains inpatient appropriate:  pending clinical improvement.   Procedures:  None.   Consultants:   none  Antimicrobials:   Anti-infectives (From admission, onward)    Start     Dose/Rate Route Frequency Ordered Stop   08/12/24 1800  azithromycin (ZITHROMAX) tablet 500 mg        500 mg Oral Daily-1800 08/12/24 0857     08/12/24 0500  Ampicillin-Sulbactam (UNASYN) 3 g in sodium chloride  0.9 % 100 mL IVPB        3 g 200 mL/hr over 30 Minutes Intravenous Every 8 hours 08/12/24 0450     08/11/24 2200  azithromycin (ZITHROMAX) 500 mg in sodium chloride  0.9 % 250 mL IVPB  Status:  Discontinued        500 mg 250 mL/hr over 60 Minutes Intravenous Every 24 hours 08/10/24 2333 08/12/24 0857   08/11/24 2200  cefTRIAXone  (ROCEPHIN )  1 g in sodium chloride  0.9 % 100 mL IVPB  Status:  Discontinued        1 g 200 mL/hr over 30 Minutes Intravenous Every 24 hours 08/10/24 2333 08/12/24 0424   08/10/24 2245  azithromycin (ZITHROMAX) 500 mg in sodium chloride  0.9 % 250 mL IVPB        500 mg 250 mL/hr over 60 Minutes Intravenous  Once 08/10/24 2242 08/11/24 0210   08/10/24 2245  cefTRIAXone  (ROCEPHIN ) 1 g in sodium chloride  0.9 % 100 mL IVPB        1 g 200 mL/hr over 30 Minutes Intravenous  Once 08/10/24 2242 08/11/24 0026        Medications  Scheduled Meds:  azithromycin  500 mg Oral q1800   benzonatate   100 mg Oral TID   feeding supplement  237 mL Oral BID BM   Continuous Infusions:  ampicillin-sulbactam (UNASYN) IV 3 g (08/12/24 0615)   PRN Meds:.acetaminophen  **OR** acetaminophen , benzonatate , guaiFENesin-dextromethorphan, hydrALAZINE, ipratropium-albuterol , melatonin, menthol, ondansetron  (ZOFRAN )  IV    Subjective:   Leonard Sweeney reports cough, shortness of breath  Objective:   Vitals:   08/12/24 0015 08/12/24 0404 08/12/24 0500 08/12/24 0805  BP: (!) 188/95 121/70  (!) 145/89  Pulse: 98   99  Resp: (!) 24   (!) 37  Temp:  98.6 F (37 C)  98.4 F (36.9 C)  TempSrc:  Oral  Oral  SpO2: 91%   96%  Weight:   101.7 kg   Height:        Intake/Output Summary (Last 24 hours) at 08/12/2024 1124 Last data filed at 08/11/2024 2300 Gross per 24 hour  Intake --  Output 400 ml  Net -400 ml   Filed Weights   08/10/24 2032 08/12/24 0500  Weight: 108.9 kg 101.7 kg     Exam  Awake Alert, Oriented X 3, No new F.N deficits, Normal affect Data entry bilaterally, with scattered rales in left lung RRR,No Gallops,Rubs or new Murmurs, No Parasternal Heave +ve B.Sounds, Abd Soft, No tenderness, No rebound - guarding or rigidity. No Cyanosis, Clubbing or edema, No new Rash or bruise      Data Reviewed:  I have personally reviewed following labs and imaging studies   CBC Lab Results  Component Value Date   WBC 5.9 08/11/2024   RBC 3.68 (L) 08/11/2024   HGB 11.5 (L) 08/11/2024   HCT 34.0 (L) 08/11/2024   MCV 92.4 08/11/2024   MCH 31.3 08/11/2024   PLT 201 08/11/2024   MCHC 33.8 08/11/2024   RDW 13.4 08/11/2024   LYMPHSABS 0.9 08/11/2024   MONOABS 1.0 08/11/2024   EOSABS 0.1 08/11/2024   BASOSABS 0.0 08/11/2024     Last metabolic panel Lab Results  Component Value Date   NA 137 08/12/2024   K 4.0 08/12/2024   CL 101 08/12/2024   CO2 24 08/12/2024   BUN 8 08/12/2024   CREATININE 1.35 (H) 08/12/2024   GLUCOSE 116 (H) 08/12/2024   GFRNONAA >60 08/12/2024   GFRAA >60 11/29/2019   CALCIUM 8.6 (L) 08/12/2024   PHOS 3.8 08/11/2024   PROT 6.7 08/11/2024   ALBUMIN 2.5 (L) 08/11/2024   BILITOT 0.7 08/11/2024   ALKPHOS 53 08/11/2024   AST 52 (H) 08/11/2024   ALT 33 08/11/2024   ANIONGAP 12 08/12/2024    CBG (last 3)  No results for input(s): GLUCAP in the  last 72 hours.    Coagulation Profile: No results for input(s): INR, PROTIME in the  last 168 hours.   Radiology Studies: ECHOCARDIOGRAM COMPLETE Result Date: 08/11/2024    ECHOCARDIOGRAM REPORT   Patient Name:   Leonard Sweeney Date of Exam: 08/11/2024 Medical Rec #:  [id number]    Height:       71.0 in Accession #:    [id number]   Weight:       240.0 lb Date of Birth:  Jun 09, 1971    BSA:          2.278 m Patient Age:    53 years     BP:           138/80 mmHg Patient Gender: M            HR:           94 bpm. Exam Location:  Inpatient Procedure: 2D Echo, Cardiac Doppler and Color Doppler (Both Spectral and Color            Flow Doppler were utilized during procedure). Indications:    Chest Pain R07.9  History:        Patient has no prior history of Echocardiogram examinations.  Sonographer:    Jayson Gaskins Referring Phys: [id number] JUSTIN B HOWERTER IMPRESSIONS  1. Left ventricular ejection fraction, by estimation, is 45 to 50%. The left ventricle has mildly decreased function. The left ventricle demonstrates regional wall motion abnormalities (see scoring diagram/findings for description). There is mild asymmetric left ventricular hypertrophy of the inferior segment. Left ventricular diastolic parameters are indeterminate.  2. Right ventricular systolic function is normal. The right ventricular size is normal. Tricuspid regurgitation signal is inadequate for assessing PA pressure.  3. Left atrial size was mildly dilated.  4. The mitral valve is normal in structure. No evidence of mitral valve regurgitation. No evidence of mitral stenosis. Moderate mitral annular calcification.  5. The aortic valve is normal in structure. There is mild calcification of the aortic valve. There is mild thickening of the aortic valve. Aortic valve regurgitation is trivial. Aortic valve sclerosis/calcification is present, without any evidence of aortic stenosis.  6. The inferior vena cava is normal in size with greater than 50%  respiratory variability, suggesting right atrial pressure of 3 mmHg. FINDINGS  Left Ventricle: Left ventricular ejection fraction, by estimation, is 45 to 50%. The left ventricle has mildly decreased function. The left ventricle demonstrates regional wall motion abnormalities. The left ventricular internal cavity size was normal in size. There is mild asymmetric left ventricular hypertrophy of the inferior segment. Left ventricular diastolic parameters are indeterminate.  LV Wall Scoring: The posterior wall and basal inferior segment are akinetic. The entire anterior wall, antero-lateral wall, entire septum, entire apex, and mid and distal inferior wall are hypokinetic. Right Ventricle: The right ventricular size is normal. No increase in right ventricular wall thickness. Right ventricular systolic function is normal. Tricuspid regurgitation signal is inadequate for assessing PA pressure. Left Atrium: Left atrial size was mildly dilated. Right Atrium: Right atrial size was normal in size. Pericardium: There is no evidence of pericardial effusion. Mitral Valve: The mitral valve is normal in structure. Moderate mitral annular calcification. No evidence of mitral valve regurgitation. No evidence of mitral valve stenosis. Tricuspid Valve: The tricuspid valve is normal in structure. Tricuspid valve regurgitation is not demonstrated. No evidence of tricuspid stenosis. Aortic Valve: The aortic valve is normal in structure. There is mild calcification of the aortic valve. There is mild thickening of the aortic valve. Aortic valve regurgitation is trivial. Aortic valve sclerosis/calcification is present, without any evidence of  aortic stenosis. Aortic valve mean gradient measures 4.0 mmHg. Aortic valve peak gradient measures 8.1 mmHg. Aortic valve area, by VTI measures 3.41 cm. Pulmonic Valve: The pulmonic valve was normal in structure. Pulmonic valve regurgitation is not visualized. No evidence of pulmonic stenosis. Aorta:  The aortic root is normal in size and structure. Venous: The inferior vena cava is normal in size with greater than 50% respiratory variability, suggesting right atrial pressure of 3 mmHg. IAS/Shunts: No atrial level shunt detected by color flow Doppler.  LEFT VENTRICLE PLAX 2D LVIDd:         5.20 cm   Diastology LVIDs:         4.00 cm   LV e' medial:    5.98 cm/s LV PW:         1.50 cm   LV E/e' medial:  15.5 LV IVS:        0.90 cm   LV e' lateral:   10.00 cm/s LVOT diam:     2.00 cm   LV E/e' lateral: 9.3 LV SV:         71 LV SV Index:   31 LVOT Area:     3.14 cm  RIGHT VENTRICLE RV S prime:     11.20 cm/s TAPSE (M-mode): 2.0 cm LEFT ATRIUM              Index        RIGHT ATRIUM           Index LA Vol (A2C):   109.0 ml 47.84 ml/m  RA Area:     11.70 cm LA Vol (A4C):   76.3 ml  33.49 ml/m  RA Volume:   28.40 ml  12.46 ml/m LA Biplane Vol: 91.4 ml  40.12 ml/m  AORTIC VALVE AV Area (Vmax):    2.90 cm AV Area (Vmean):   3.60 cm AV Area (VTI):     3.41 cm AV Vmax:           142.00 cm/s AV Vmean:          95.100 cm/s AV VTI:            0.209 m AV Peak Grad:      8.1 mmHg AV Mean Grad:      4.0 mmHg LVOT Vmax:         131.00 cm/s LVOT Vmean:        109.000 cm/s LVOT VTI:          0.227 m LVOT/AV VTI ratio: 1.09  AORTA Ao Root diam: 2.80 cm MITRAL VALVE MV Area (PHT): 4.71 cm    SHUNTS MV Decel Time: 161 msec    Systemic VTI:  0.23 m MV E velocity: 92.80 cm/s  Systemic Diam: 2.00 cm MV A velocity: 55.00 cm/s MV E/A ratio:  1.69 Kardie Tobb DO Electronically signed by Dub Huntsman DO Signature Date/Time: 08/11/2024/11:33:12 AM    Final    CT Angio Chest Pulmonary Embolism (PE) W or WO Contrast Result Date: 08/11/2024 CLINICAL DATA:  Shortness of breath.  Cough.  Elevated D-dimer. EXAM: CT ANGIOGRAPHY CHEST WITH CONTRAST TECHNIQUE: Multidetector CT imaging of the chest was performed using the standard protocol during bolus administration of intravenous contrast. Multiplanar CT image reconstructions and MIPs  were obtained to evaluate the vascular anatomy. RADIATION DOSE REDUCTION: This exam was performed according to the departmental dose-optimization program which includes automated exposure control, adjustment of the mA and/or kV according to patient size and/or use of iterative reconstruction technique.  CONTRAST:  75mL OMNIPAQUE  IOHEXOL  350 MG/ML SOLN COMPARISON:  11/27/2019 FINDINGS: Cardiovascular: Satisfactory opacification of pulmonary arteries noted, and no pulmonary emboli identified. No evidence of thoracic aortic dissection or aneurysm. Mediastinum/Nodes: Mild right paratracheal, subcarinal, and bilateral hilar lymphadenopathy shows no significant change since prior exam. No new or increased sites of lymphadenopathy seen. Lungs/Pleura: New airspace consolidation is seen involving a majority of the left lower lobe, suspicious for pneumonia. No evidence of central endobronchial obstruction. Right lower lobe scarring shows no significant change compared to prior study. Upper abdomen: No acute findings. Musculoskeletal: No suspicious bone lesions identified. Review of the MIP images confirms the above findings. IMPRESSION: No evidence of pulmonary embolism. New airspace consolidation involving majority of the left lower lobe, suspicious for pneumonia. Recommend continued chest radiographic or CT follow-up to confirm resolution. Stable mild mediastinal and bilateral hilar lymphadenopathy. Electronically Signed   By: Norleen DELENA Kil M.D.   On: 08/11/2024 11:18   DG Chest Portable 1 View Result Date: 08/10/2024 CLINICAL DATA:  Chest pain EXAM: PORTABLE CHEST 1 VIEW COMPARISON:  03/07/2023 FINDINGS: Right lung grossly clear. Mild cardiomegaly. Patchy airspace opacities in the left mid to lower lung. No pneumothorax IMPRESSION: Patchy airspace opacities in the left mid to lower lung, possible pneumonia. Imaging follow-up to resolution is recommended. Electronically Signed   By: Luke Bun M.D.   On: 08/10/2024  21:21       Brayton Lye M.D. [facility] 08/12/2024, 11:24 AM  Available via Epic secure chat 7am-7pm After 7 pm, please refer to night coverage provider listed on amion.

## 2024-08-12 NOTE — Progress Notes (Signed)
 PHARMACIST - PHYSICIAN COMMUNICATION DR:   Elgergawy CONCERNING: Antibiotic IV to Oral Route Change Policy  RECOMMENDATION: This patient is receiving azith by the intravenous route.  Based on criteria approved by the Pharmacy and Therapeutics Committee, the antibiotic(s) is/are being converted to the equivalent oral dose form(s).   DESCRIPTION: These criteria include: Patient being treated for a respiratory tract infection, urinary tract infection, cellulitis or clostridium difficile associated diarrhea if on metronidazole The patient is not neutropenic and does not exhibit a GI malabsorption state The patient is eating (either orally or via tube) and/or has been taking other orally administered medications for a least 24 hours The patient is improving clinically and has a Tmax < 100.5  If you have questions about this conversion, please contact the Pharmacy Department  []   [phone] )  [facility] []   [phone] )  [facility] [x]   [phone] )  [facility] []   [phone] )  [facility] []   [phone] )  [facility]

## 2024-08-12 NOTE — Care Management (Signed)
 Transition of Care [facility]) - Inpatient Brief Assessment   Patient Details  Name: Leonard Sweeney MRN: [id number] Date of Birth: 1971/08/11  Transition [facility] Care [facility]) CM/SW Contact:    Corean JAYSON Canary, RN Phone Number: 08/12/2024, 11:28 AM   Clinical Narrative:  Patient presented with 1 week chills, cough, presented to [facility] and sent to [facility] ED. Hx of cocaine use last 11/11. SA resources OP provided on AVS.  The patient is uninsured and does not have PCP.  Will need to make appt for F/U with PCP,  may need MATCH, [facility].   TOC will follow  Transition of Care Asessment: Insurance and Status: Selfpay Patient has primary care physician: No Home environment has been reviewed: Single lives in home Prior level of function:: Independent Prior/Current Home Services: No current home services Social Drivers of Health Review:  (Patient declined to answer) Readmission risk has been reviewed: Yes Transition of care needs: transition of care needs identified, TOC will continue to follow

## 2024-08-12 NOTE — TOC Progression Note (Signed)
 Transition of Care [facility]) - Progression Note    Patient Details  Name: Leonard Sweeney MRN: [id number] Date of Birth: 1971-02-25  Transition of Care [facility]) CM/SW Contact  Bernardino Ioannis, [place] Phone Number: 08/12/2024, 11:52 AM  Clinical Narrative:    Consult for SUD (cocaine) received. CSW met with patient at bedside to discuss. Patient agreeable. Shares he was formerly incarcerated for around 20 years but has been in the community for around 10. Shares that he has had several periods of use and abstinence. Shares he has completed residential treatment at [facility] twice, at one point was even employed there. Estimates to have used cocaine around 2 times in the last week or two. States recent use has been consistently near this amount. Denies other substances but states he is never sure what else the drugs are composed of these days. States that he has a strong motivation to make behavioral and health changes, identifies a prominent barrier in his current  living situation. He is currently staying with a friend, has been there for 3 months, but that the home has many people going in and out and the activities and behavior of the residents/guests are not entirely consistent with the lifestyle he wants for himself. States it is a large contributory factor in his recurrent substance use. Support and empathy provided. States the living situation is not working out and that he does not want to return. Feels he would do better on his own. States he is not sure where else he would go. States work has been unsustainable d/t his history.   We reviewed the supports he has in the community. He does identify several support persons, friends and family. States they are coming over later. SW encouraged him to discuss the housing situation with them. Patient requested resources for local housing supports. These were provided and reviewed in detail. Follow up encouraged. Patient declined SUD resources, citing  familiarity with local providers and previous experience completing and working for treatment providers. He states he would likely benefit more from a change in environment than completing additional SUD treatment.                      Expected Discharge Plan and Services                                               Social Drivers of Health (SDOH) Interventions SDOH Screenings   Food Insecurity: Patient Declined (08/11/2024)  Housing: Unknown (08/11/2024)  Transportation Needs: Patient Declined (08/11/2024)  Utilities: Patient Declined (08/11/2024)  Alcohol Screen: Medium Risk (09/30/2019)  Tobacco Use: Low Risk  (08/11/2024)    Readmission Risk Interventions     No data to display

## 2024-08-13 LAB — CBC
HCT: 30.6 % — ABNORMAL LOW (ref 39.0–52.0)
Hemoglobin: 10.6 g/dL — ABNORMAL LOW (ref 13.0–17.0)
MCH: 31.1 pg (ref 26.0–34.0)
MCHC: 34.6 g/dL (ref 30.0–36.0)
MCV: 89.7 fL (ref 80.0–100.0)
Platelets: 228 K/uL (ref 150–400)
RBC: 3.41 MIL/uL — ABNORMAL LOW (ref 4.22–5.81)
RDW: 13.7 % (ref 11.5–15.5)
WBC: 5.9 K/uL (ref 4.0–10.5)
nRBC: 0 % (ref 0.0–0.2)

## 2024-08-13 LAB — BASIC METABOLIC PANEL WITH GFR
Anion gap: 10 (ref 5–15)
BUN: 8 mg/dL (ref 6–20)
CO2: 24 mmol/L (ref 22–32)
Calcium: 8.4 mg/dL — ABNORMAL LOW (ref 8.9–10.3)
Chloride: 102 mmol/L (ref 98–111)
Creatinine, Ser: 1.04 mg/dL (ref 0.61–1.24)
GFR, Estimated: >60 mL/min (ref >60)
Glucose, Bld: 118 mg/dL — ABNORMAL HIGH (ref 70–99)
Potassium: 3.6 mmol/L (ref 3.5–5.1)
Sodium: 136 mmol/L (ref 135–145)

## 2024-08-13 NOTE — Progress Notes (Signed)
 [facility]                                                                               Leonard Sweeney, is a 53 y.o. adult, DOB - 07-30-1971, FMW:[id number] Admit date - 08/10/2024    Outpatient Primary MD for the patient is Pcp, No  LOS - 3  days    Brief summary   Leonard Sweeney is a 53 y.o. male with medical history significant for cocaine abuse, who is admitted to [facility] on 08/10/2024 with severe sepsis due to suspected community-acquired pneumonia after presenting from home to [facility] ED complaining of shortness of breath.   He acknowledges recurrent cocaine use, and conveys that most recent cocaine use occurred on 08/09/2024.    Assessment & Plan    Sepsis POA, due to community-acquired pneumonia Acute respiratory failure with hypoxia - Sepsis POA, tachypneic, tachycardic and hypoxic, as well fever 102.1. - On IV Rocephin  and azithromycin, remains febrile, was changed to Unasyn and erythromycin  - Follow-up blood cultures, so far remains negative. - Follow on sputum cultures. - Legionella antigen and strep pneumonia antigen is negative. - Remains with congestion, he was encouraged to use incentive spirometry and flutter valve. - He was encouraged to use incentive spirometry and flutter valve. - CTA chest negative for PE  Elevated D-dimers -CTA chest negative for PE  Cocaine abuse/substance abuse Counseled on cessation TOC  consulted for resources  Elevated troponins - Demand ischemia in the setting of hypoxia, and sepsis and cocaine use - The echo with mildly reduced EF 45 to 50% and regional wall motion abnormalities - No beta-blockers given cocaine use. -  started on aspirin - Further workup can be pursued as an outpatient, chest with cardiology, [facility] arranged for outpatient follow-up   AKI Mild rhabdo - Continue with IV fluids, avoid nephrotoxic medication - Checked  CK due to recent cocaine use, especially with his UA significant for  positive urine dipstick with negative red blood cells and concern for rhabdo, total CK mildly elevated at 640, concern for mild labs, improving with IV fluids.     Estimated body mass index is 31.46 kg/m as calculated from the following:   Height as of this encounter: 5' 11 (1.803 m).   Weight as of this encounter: 102.3 kg.  Code Status: full code DVT Prophylaxis:  SCDs Start: 08/10/24 2333   Level of Care: Level of care: Progressive Family Communication: none at bedside.   Disposition Plan:     Remains inpatient appropriate:  pending clinical improvement.   Procedures:  None.   Consultants:   none  Antimicrobials:   Anti-infectives (From admission, onward)    Start     Dose/Rate Route Frequency Ordered Stop   08/12/24 1800  azithromycin (ZITHROMAX) tablet 500 mg        500 mg Oral Daily-1800 08/12/24 0857     08/12/24 0500  Ampicillin-Sulbactam (UNASYN) 3 g in sodium chloride  0.9 % 100 mL IVPB        3 g 200 mL/hr over 30 Minutes Intravenous Every 8 hours 08/12/24 0450     08/11/24 2200  azithromycin (ZITHROMAX) 500 mg in sodium  chloride 0.9 % 250 mL IVPB  Status:  Discontinued        500 mg 250 mL/hr over 60 Minutes Intravenous Every 24 hours 08/10/24 2333 08/12/24 0857   08/11/24 2200  cefTRIAXone  (ROCEPHIN ) 1 g in sodium chloride  0.9 % 100 mL IVPB  Status:  Discontinued        1 g 200 mL/hr over 30 Minutes Intravenous Every 24 hours 08/10/24 2333 08/12/24 0424   08/10/24 2245  azithromycin (ZITHROMAX) 500 mg in sodium chloride  0.9 % 250 mL IVPB        500 mg 250 mL/hr over 60 Minutes Intravenous  Once 08/10/24 2242 08/11/24 0210   08/10/24 2245  cefTRIAXone  (ROCEPHIN ) 1 g in sodium chloride  0.9 % 100 mL IVPB        1 g 200 mL/hr over 30 Minutes Intravenous  Once 08/10/24 2242 08/11/24 0026        Medications  Scheduled Meds:  aspirin EC  81 mg Oral Daily   azithromycin  500 mg Oral q1800   benzonatate   100 mg Oral TID   feeding supplement  237 mL Oral  BID BM   Continuous Infusions:  sodium chloride  50 mL/hr at 08/12/24 1249   ampicillin-sulbactam (UNASYN) IV 3 g (08/13/24 0540)   PRN Meds:.acetaminophen  **OR** acetaminophen , benzonatate , guaiFENesin-dextromethorphan, hydrALAZINE, ipratropium-albuterol , melatonin, menthol, ondansetron  (ZOFRAN ) IV    Subjective:   Leonard Sweeney reports he is feeling better, dyspnea and cough has improved, but still having significant congestion  Objective:   Vitals:   08/13/24 0400 08/13/24 0500 08/13/24 0600 08/13/24 0832  BP:    (!) 143/76  Pulse: 100  90 92  Resp: 18   20  Temp:    97.8 F (36.6 C)  TempSrc:    Oral  SpO2: 97%  94% 94%  Weight:  102.3 kg    Height:        Intake/Output Summary (Last 24 hours) at 08/13/2024 1017 Last data filed at 08/12/2024 1413 Gross per 24 hour  Intake --  Output 400 ml  Net -400 ml   Filed Weights   08/10/24 2032 08/12/24 0500 08/13/24 0500  Weight: 108.9 kg 101.7 kg 102.3 kg     Exam  Awake Alert, Oriented X 3, No new F.N deficits, Normal affect Good air entry today, no wheezing, left lung Rales present RRR,No Gallops,Rubs or new Murmurs, No Parasternal Heave +ve B.Sounds, Abd Soft, No tenderness, No rebound - guarding or rigidity. No Cyanosis, Clubbing or edema, No new Rash or bruise       Data Reviewed:  I have personally reviewed following labs and imaging studies   CBC Lab Results  Component Value Date   WBC 5.9 08/13/2024   RBC 3.41 (L) 08/13/2024   HGB 10.6 (L) 08/13/2024   HCT 30.6 (L) 08/13/2024   MCV 89.7 08/13/2024   MCH 31.1 08/13/2024   PLT 228 08/13/2024   MCHC 34.6 08/13/2024   RDW 13.7 08/13/2024   LYMPHSABS 0.9 08/11/2024   MONOABS 1.0 08/11/2024   EOSABS 0.1 08/11/2024   BASOSABS 0.0 08/11/2024     Last metabolic panel Lab Results  Component Value Date   NA 136 08/13/2024   K 3.6 08/13/2024   CL 102 08/13/2024   CO2 24 08/13/2024   BUN 8 08/13/2024   CREATININE 1.04 08/13/2024   GLUCOSE 118  (H) 08/13/2024   GFRNONAA >60 08/13/2024   GFRAA >60 11/29/2019   CALCIUM 8.4 (L) 08/13/2024   PHOS 3.8 08/11/2024  PROT 6.7 08/11/2024   ALBUMIN 2.5 (L) 08/11/2024   BILITOT 0.7 08/11/2024   ALKPHOS 53 08/11/2024   AST 52 (H) 08/11/2024   ALT 33 08/11/2024   ANIONGAP 10 08/13/2024    CBG (last 3)  No results for input(s): GLUCAP in the last 72 hours.    Coagulation Profile: No results for input(s): INR, PROTIME in the last 168 hours.   Radiology Studies: CT Angio Chest Pulmonary Embolism (PE) W or WO Contrast Result Date: 08/11/2024 CLINICAL DATA:  Shortness of breath.  Cough.  Elevated D-dimer. EXAM: CT ANGIOGRAPHY CHEST WITH CONTRAST TECHNIQUE: Multidetector CT imaging of the chest was performed using the standard protocol during bolus administration of intravenous contrast. Multiplanar CT image reconstructions and MIPs were obtained to evaluate the vascular anatomy. RADIATION DOSE REDUCTION: This exam was performed according to the departmental dose-optimization program which includes automated exposure control, adjustment of the mA and/or kV according to patient size and/or use of iterative reconstruction technique. CONTRAST:  75mL OMNIPAQUE  IOHEXOL  350 MG/ML SOLN COMPARISON:  11/27/2019 FINDINGS: Cardiovascular: Satisfactory opacification of pulmonary arteries noted, and no pulmonary emboli identified. No evidence of thoracic aortic dissection or aneurysm. Mediastinum/Nodes: Mild right paratracheal, subcarinal, and bilateral hilar lymphadenopathy shows no significant change since prior exam. No new or increased sites of lymphadenopathy seen. Lungs/Pleura: New airspace consolidation is seen involving a majority of the left lower lobe, suspicious for pneumonia. No evidence of central endobronchial obstruction. Right lower lobe scarring shows no significant change compared to prior study. Upper abdomen: No acute findings. Musculoskeletal: No suspicious bone lesions identified.  Review of the MIP images confirms the above findings. IMPRESSION: No evidence of pulmonary embolism. New airspace consolidation involving majority of the left lower lobe, suspicious for pneumonia. Recommend continued chest radiographic or CT follow-up to confirm resolution. Stable mild mediastinal and bilateral hilar lymphadenopathy. Electronically Signed   By: Norleen DELENA Kil M.D.   On: 08/11/2024 11:18       Leonard Sweeney M.D. [facility] 08/13/2024, 10:17 AM  Available via Epic secure chat 7am-7pm After 7 pm, please refer to night coverage provider listed on amion.

## 2024-08-13 NOTE — Plan of Care (Signed)
  Problem: Education: Goal: Knowledge of General Education information will improve Description: Including pain rating scale, medication(s)/side effects and non-pharmacologic comfort measures Outcome: Progressing   Problem: Clinical Measurements: Goal: Ability to maintain clinical measurements within normal limits will improve Outcome: Progressing   Problem: Activity: Goal: Risk for activity intolerance will decrease Outcome: Progressing   Problem: Coping: Goal: Level of anxiety will decrease Outcome: Progressing   Problem: Activity: Goal: Ability to tolerate increased activity will improve Outcome: Progressing   Problem: Respiratory: Goal: Ability to maintain adequate ventilation will improve Outcome: Progressing Goal: Ability to maintain a clear airway will improve Outcome: Progressing

## 2024-08-14 MED ORDER — AZITHROMYCIN 500 MG PO TABS
500.0000 mg | ORAL_TABLET | Freq: Every day | ORAL | Status: AC
  Administered 2024-08-14: 500 mg via ORAL
  Filled 2024-08-14: qty 1

## 2024-08-14 NOTE — Progress Notes (Addendum)
 SATURATION QUALIFICATIONS: (This note is used to comply with regulatory documentation for home oxygen)  Patient Saturations on Room Air at Rest = 97%  Patient Saturations on Room Air while Ambulating = 86%  Patient Saturations on 2 Liters of oxygen while Ambulating = 98%  Please briefly explain why patient needs home oxygen: Pt ambulated approx. 110 feet in hallway and O2 sats dropped to 86% on RA while ambulating, placed pt on 2L [place] and O2 sats increased to 98% on 2L [place]. Pt requires oxygen to treat hypoxia.

## 2024-08-14 NOTE — Plan of Care (Signed)

## 2024-08-14 NOTE — Progress Notes (Signed)
 [facility]                                                                               Giancarlo Askren, is a 53 y.o. adult, DOB - 02-26-1971, FMW:[id number] Admit date - 08/10/2024    Outpatient Primary MD for the patient is Pcp, No  LOS - 4  days    Brief summary   Leonard Sweeney is a 53 y.o. male with medical history significant for cocaine abuse, who is admitted to [facility] on 08/10/2024 with severe sepsis due to suspected community-acquired pneumonia after presenting from home to [facility] ED complaining of shortness of breath.   He acknowledges recurrent cocaine use, and conveys that most recent cocaine use occurred on 08/09/2024.    Assessment & Plan    Sepsis POA, due to community-acquired pneumonia Acute respiratory failure with hypoxia - Sepsis POA, tachypneic, tachycardic and hypoxic, as well fever 102.1. - On IV Rocephin  and azithromycin, remains febrile, was changed to Unasyn and erythromycin  - Follow-up blood cultures, so far remains negative. - Follow on sputum cultures. - Legionella antigen and strep pneumonia antigen is negative. - Remains with congestion, he was encouraged to use incentive spirometry and flutter valve. - He was encouraged to use incentive spirometry and flutter valve. - CTA chest negative for PE - Discussed with staff to ambulate in the hallway and to see if he has any oxygen requirement with activity.  Elevated D-dimers -CTA chest negative for PE  Cocaine abuse/substance abuse Counseled on cessation TOC  consulted for resources  Elevated troponins - Demand ischemia in the setting of hypoxia, and sepsis and cocaine use - The echo with mildly reduced EF 45 to 50% and regional wall motion abnormalities - No beta-blockers given cocaine use. -  started on aspirin - Further workup can be pursued as an outpatient, chest with cardiology, [facility] arranged for outpatient follow-up   AKI Mild rhabdo - Continue with IV fluids,  avoid nephrotoxic medication - Checked  CK due to recent cocaine use, especially with his UA significant for positive urine dipstick with negative red blood cells and concern for rhabdo, total CK mildly elevated at 640, concern for mild labs, improving with IV fluids.     Estimated body mass index is 31.27 kg/m as calculated from the following:   Height as of this encounter: 5' 11 (1.803 m).   Weight as of this encounter: 101.7 kg.  Code Status: full code DVT Prophylaxis:  SCDs Start: 08/10/24 2333   Level of Care: Level of care: Progressive Family Communication: none at bedside.   Disposition Plan:     Remains inpatient appropriate:   Procedures:  None.   Consultants:   none  Antimicrobials:   Anti-infectives (From admission, onward)    Start     Dose/Rate Route Frequency Ordered Stop   08/12/24 1800  azithromycin (ZITHROMAX) tablet 500 mg        500 mg Oral Daily-1800 08/12/24 0857     08/12/24 0500  Ampicillin-Sulbactam (UNASYN) 3 g in sodium chloride  0.9 % 100 mL IVPB        3 g 200 mL/hr over 30 Minutes Intravenous Every 8  hours 08/12/24 0450     08/11/24 2200  azithromycin (ZITHROMAX) 500 mg in sodium chloride  0.9 % 250 mL IVPB  Status:  Discontinued        500 mg 250 mL/hr over 60 Minutes Intravenous Every 24 hours 08/10/24 2333 08/12/24 0857   08/11/24 2200  cefTRIAXone  (ROCEPHIN ) 1 g in sodium chloride  0.9 % 100 mL IVPB  Status:  Discontinued        1 g 200 mL/hr over 30 Minutes Intravenous Every 24 hours 08/10/24 2333 08/12/24 0424   08/10/24 2245  azithromycin (ZITHROMAX) 500 mg in sodium chloride  0.9 % 250 mL IVPB        500 mg 250 mL/hr over 60 Minutes Intravenous  Once 08/10/24 2242 08/11/24 0210   08/10/24 2245  cefTRIAXone  (ROCEPHIN ) 1 g in sodium chloride  0.9 % 100 mL IVPB        1 g 200 mL/hr over 30 Minutes Intravenous  Once 08/10/24 2242 08/11/24 0026        Medications  Scheduled Meds:  aspirin EC  81 mg Oral Daily   azithromycin  500 mg  Oral q1800   benzonatate   100 mg Oral TID   feeding supplement  237 mL Oral BID BM   Continuous Infusions:  sodium chloride  50 mL/hr at 08/13/24 1331   ampicillin-sulbactam (UNASYN) IV 3 g (08/14/24 0615)   PRN Meds:.acetaminophen  **OR** acetaminophen , benzonatate , guaiFENesin-dextromethorphan, hydrALAZINE, ipratropium-albuterol , melatonin, menthol, ondansetron  (ZOFRAN ) IV    Subjective:   Leonard Sweeney reports worse cough, with phlegm, reports he has been doing well with his incentive and flutter  Objective:   Vitals:   08/13/24 1958 08/13/24 2357 08/14/24 0456 08/14/24 0525  BP: 136/78   (!) 144/84  Pulse:      Resp:      Temp: 97.6 F (36.4 C) 97.9 F (36.6 C)  98.7 F (37.1 C)  TempSrc: Oral Oral  Axillary  SpO2:  94%    Weight:   101.7 kg   Height:       No intake or output data in the 24 hours ending 08/14/24 1108  Filed Weights   08/12/24 0500 08/13/24 0500 08/14/24 0456  Weight: 101.7 kg 102.3 kg 101.7 kg     Exam  Awake Alert, Oriented X 3, No new F.N deficits, Normal affect Symmetrical Chest wall movement, Good air movement bilaterally RRR,No Gallops,Rubs or new Murmurs, No Parasternal Heave +ve B.Sounds, Abd Soft, No tenderness, No rebound - guarding or rigidity. No Cyanosis, Clubbing or edema, No new Rash or bruise        Data Reviewed:  I have personally reviewed following labs and imaging studies   CBC Lab Results  Component Value Date   WBC 5.9 08/13/2024   RBC 3.41 (L) 08/13/2024   HGB 10.6 (L) 08/13/2024   HCT 30.6 (L) 08/13/2024   MCV 89.7 08/13/2024   MCH 31.1 08/13/2024   PLT 228 08/13/2024   MCHC 34.6 08/13/2024   RDW 13.7 08/13/2024   LYMPHSABS 0.9 08/11/2024   MONOABS 1.0 08/11/2024   EOSABS 0.1 08/11/2024   BASOSABS 0.0 08/11/2024     Last metabolic panel Lab Results  Component Value Date   NA 136 08/13/2024   K 3.6 08/13/2024   CL 102 08/13/2024   CO2 24 08/13/2024   BUN 8 08/13/2024   CREATININE 1.04 08/13/2024    GLUCOSE 118 (H) 08/13/2024   GFRNONAA >60 08/13/2024   GFRAA >60 11/29/2019   CALCIUM 8.4 (L) 08/13/2024   PHOS  3.8 08/11/2024   PROT 6.7 08/11/2024   ALBUMIN 2.5 (L) 08/11/2024   BILITOT 0.7 08/11/2024   ALKPHOS 53 08/11/2024   AST 52 (H) 08/11/2024   ALT 33 08/11/2024   ANIONGAP 10 08/13/2024    CBG (last 3)  No results for input(s): GLUCAP in the last 72 hours.    Coagulation Profile: No results for input(s): INR, PROTIME in the last 168 hours.   Radiology Studies: No results found.      Brayton Lye M.D. [facility] 08/14/2024, 11:08 AM  Available via Epic secure chat 7am-7pm After 7 pm, please refer to night coverage provider listed on amion.

## 2024-08-14 NOTE — Plan of Care (Signed)
  Problem: Clinical Measurements: Goal: Ability to maintain clinical measurements within normal limits will improve Outcome: Progressing   Problem: Coping: Goal: Level of anxiety will decrease Outcome: Progressing   Problem: Activity: Goal: Ability to tolerate increased activity will improve Outcome: Progressing

## 2024-08-14 NOTE — Plan of Care (Signed)
  Problem: Health Behavior/Discharge Planning: Goal: Ability to manage health-related needs will improve Outcome: Progressing   Problem: Clinical Measurements: Goal: Respiratory complications will improve Outcome: Progressing   Problem: Respiratory: Goal: Ability to maintain a clear airway will improve Outcome: Progressing

## 2024-08-15 ENCOUNTER — Other Ambulatory Visit (HOSPITAL_COMMUNITY): Payer: Self-pay

## 2024-08-15 MED ORDER — AMOXICILLIN-POT CLAVULANATE 875-125 MG PO TABS
1.0000 | ORAL_TABLET | Freq: Two times a day (BID) | ORAL | 0 refills | Status: AC
  Filled 2024-08-15: qty 6, 3d supply, fill #0

## 2024-08-15 MED ORDER — ACETAMINOPHEN 325 MG PO TABS: 650.0000 mg | ORAL_TABLET | Freq: Four times a day (QID) | ORAL | Status: AC | PRN

## 2024-08-15 MED ORDER — ASPIRIN 81 MG PO TBEC
81.0000 mg | DELAYED_RELEASE_TABLET | Freq: Every day | ORAL | 0 refills | Status: AC
  Filled 2024-08-15: qty 30, 30d supply, fill #0

## 2024-08-15 NOTE — Progress Notes (Signed)
 Discharge   Patient expressed verbal understanding of discharge POC.   Patient given time to ask any questions.  Additional education included in AVS.  Alert oriented in good spirits.   PIV removed.  Pressure dressings intact.  TOC meds in process and discharging to the lounge. Ride on the way.

## 2024-08-15 NOTE — Discharge Instructions (Signed)
Follow with Primary MD in 7 days   Get CBC, CMP, 2 view Chest X ray checked  by Primary MD next visit.    Activity: As tolerated with Full fall precautions use walker/cane & assistance as needed   Disposition Home    Diet: Heart Healthy .   On your next visit with your primary care physician please Get Medicines reviewed and adjusted.   Please request your Prim.MD to go over all Hospital Tests and Procedure/Radiological results at the follow up, please get all Hospital records sent to your Prim MD by signing hospital release before you go home.   If you experience worsening of your admission symptoms, develop shortness of breath, life threatening emergency, suicidal or homicidal thoughts you must seek medical attention immediately by calling 911 or calling your MD immediately  if symptoms less severe.  You Must read complete instructions/literature along with all the possible adverse reactions/side effects for all the Medicines you take and that have been prescribed to you. Take any new Medicines after you have completely understood and accpet all the possible adverse reactions/side effects.   Do not drive, operating heavy machinery, perform activities at heights, swimming or participation in water activities or provide baby sitting services if your were admitted for syncope or siezures until you have seen by Primary MD or a Neurologist and advised to do so again.  Do not drive when taking Pain medications.    Do not take more than prescribed Pain, Sleep and Anxiety Medications  Special Instructions: If you have smoked or chewed Tobacco  in the last 2 yrs please stop smoking, stop any regular Alcohol  and or any Recreational drug use.  Wear Seat belts while driving.   Please note  You were cared for by a hospitalist during your hospital stay. If you have any questions about your discharge medications or the care you received while you were in the hospital after you are  discharged, you can call the unit and asked to speak with the hospitalist on call if the hospitalist that took care of you is not available. Once you are discharged, your primary care physician will handle any further medical issues. Please note that NO REFILLS for any discharge medications will be authorized once you are discharged, as it is imperative that you return to your primary care physician (or establish a relationship with a primary care physician if you do not have one) for your aftercare needs so that they can reassess your need for medications and monitor your lab values.  

## 2024-08-15 NOTE — Discharge Summary (Addendum)
 Physician Discharge Summary  Leonard Sweeney FMW:[id number] DOB: 12/06/1970 DOA: 08/10/2024  PCP: Pcp, No  Admit date: 08/10/2024 Discharge date: 08/15/2024  Admitted From: (Home) Disposition:  (Home)  Recommendations for Outpatient Follow-up:  Follow up with PCP in 1-2 weeks Please obtain BMP/CBC in one week Please continue counseling about substance/cocaine/tobacco use Patient to follow-up with cardiology to schedule appointment 08/23/2024 for up normal finding/regional wall motion abnormalities on his echo.   Diet recommendation: Heart Healthy   Brief/Interim Summary:  [name] is a 53 y.o. male with medical history significant for cocaine abuse, who is admitted to [facility] on 08/10/2024 with severe sepsis due to suspected community-acquired pneumonia after presenting from home to [facility] ED complaining of shortness of breath.    He acknowledges recurrent cocaine use, and conveys that most recent cocaine use occurred on 08/09/2024.   Sepsis POA, due to community-acquired pneumonia Acute respiratory failure with hypoxia - Sepsis POA, tachypneic, tachycardic and hypoxic, as well fever 102.1. - Treated with IV Rocephin  and azithromycin during hospital stay, he was broadened to Unasyn and azithromycin, his septic workup including blood cultures, sputum culture, Legionella antigen and strep pneumonia antigen is negative . - He will be discharged on 3 days of Augmentin  as an outpatient . - He was encouraged to take his incentive spirometer and flutter valve and keep using them at home.   -Hypoxia has resolved, ambulated in the hallway today maintaining oxygen 90% on room air - CTA chest negative for PE   Elevated D-dimers -CTA chest negative for PE   Cocaine abuse/substance abuse Counseled on cessation TOC  consulted for resources   Elevated troponins/regional Wall motion abnormalities on echo - Demand ischemia in the setting of hypoxia, and sepsis and cocaine use - The  echo with mildly reduced EF 45 to 50% and regional wall motion abnormalities - No beta-blockers given cocaine use. -  started on aspirin - Further workup can be pursued as an outpatient, [facility] cardiology has arranged for outpatient follow-up.  AKI Mild rhabdo - Treated with IV fluids, avoid nephrotoxic medication - Checked  CK due to recent cocaine use, especially with his UA significant for positive urine dipstick with negative red blood cells and concern for rhabdo, total CK mildly elevated at 640, concern for mild labs, improving with IV fluids.     Obesity class 1 Estimated body mass index is 31.27 kg/m as calculated from the following:   Height as of this encounter: 5' 11 (1.803 m).   Weight as of this encounter: 101.7 kg.    Discharge Diagnoses:  Principal Problem:   CAP (community acquired pneumonia) Active Problems:   Severe sepsis (HCC)   SOB (shortness of breath)   Elevated troponin   Acute hypoxic respiratory failure (HCC)   Cocaine abuse [facility])    Discharge Instructions  Discharge Instructions     Diet - low sodium heart healthy   Complete by: As directed    Increase activity slowly   Complete by: As directed       Allergies as of 08/15/2024   No Known Allergies      Medication List     STOP taking these medications    ibuprofen  200 MG tablet Commonly known as: ADVIL        TAKE these medications    acetaminophen  325 MG tablet Commonly known as: TYLENOL  Take 2 tablets (650 mg total) by mouth every 6 (six) hours as needed for mild pain (pain score 1-3) (or Fever >/= 101).  amoxicillin -clavulanate 875-125 MG tablet Commonly known as: AUGMENTIN  Take 1 tablet by mouth 2 (two) times daily for 3 days.   aspirin EC 81 MG tablet Take 1 tablet (81 mg total) by mouth daily. Swallow whole.        Follow-up Information     [facility] Follow up.   Specialty: Family Medicine Contact information: [address] [facility]  [address] [phone] Additional information: [address]  [address]  [place], [place] [address]    - Parking lot with [employer], [employer], [employer]; Next to [facility]   - [facility] shopping - Off of [address]          Floretta Mallard, MD Follow up on 08/23/2024.   Specialty: Cardiology Why: 08/23/2024 8:20 AM Floretta Mallard, MD Contact information: [address] [place] [address] [phone]                No Known Allergies  Consultations: None   Procedures/Studies: ECHOCARDIOGRAM COMPLETE Result Date: 08/11/2024    ECHOCARDIOGRAM REPORT   Patient Name:   ABLE Sweeney Date of Exam: 08/11/2024 Medical Rec #:  [id number]    Height:       71.0 in Accession #:    [id number]   Weight:       240.0 lb Date of Birth:  19-Aug-1971    BSA:          2.278 m Patient Age:    53 years     BP:           138/80 mmHg Patient Gender: M            HR:           94 bpm. Exam Location:  Inpatient Procedure: 2D Echo, Cardiac Doppler and Color Doppler (Both Spectral and Color            Flow Doppler were utilized during procedure). Indications:    Chest Pain R07.9  History:        Patient has no prior history of Echocardiogram examinations.  Sonographer:    Jayson Gaskins Referring Phys: [id number] JUSTIN B HOWERTER IMPRESSIONS  1. Left ventricular ejection fraction, by estimation, is 45 to 50%. The left ventricle has mildly decreased function. The left ventricle demonstrates regional wall motion abnormalities (see scoring diagram/findings for description). There is mild asymmetric left ventricular hypertrophy of the inferior segment. Left ventricular diastolic parameters are indeterminate.  2. Right ventricular systolic function is normal. The right ventricular size is normal. Tricuspid regurgitation signal is inadequate for assessing PA pressure.  3. Left atrial size was mildly dilated.  4. The mitral  valve is normal in structure. No evidence of mitral valve regurgitation. No evidence of mitral stenosis. Moderate mitral annular calcification.  5. The aortic valve is normal in structure. There is mild calcification of the aortic valve. There is mild thickening of the aortic valve. Aortic valve regurgitation is trivial. Aortic valve sclerosis/calcification is present, without any evidence of aortic stenosis.  6. The inferior vena cava is normal in size with greater than 50% respiratory variability, suggesting right atrial pressure of 3 mmHg. FINDINGS  Left Ventricle: Left ventricular ejection fraction, by estimation, is 45 to 50%. The left ventricle has mildly decreased function. The left ventricle demonstrates regional wall motion abnormalities. The left ventricular internal cavity size was normal in size. There is mild asymmetric left ventricular hypertrophy of the inferior segment. Left ventricular diastolic parameters  are indeterminate.  LV Wall Scoring: The posterior wall and basal inferior segment are akinetic. The entire anterior wall, antero-lateral wall, entire septum, entire apex, and mid and distal inferior wall are hypokinetic. Right Ventricle: The right ventricular size is normal. No increase in right ventricular wall thickness. Right ventricular systolic function is normal. Tricuspid regurgitation signal is inadequate for assessing PA pressure. Left Atrium: Left atrial size was mildly dilated. Right Atrium: Right atrial size was normal in size. Pericardium: There is no evidence of pericardial effusion. Mitral Valve: The mitral valve is normal in structure. Moderate mitral annular calcification. No evidence of mitral valve regurgitation. No evidence of mitral valve stenosis. Tricuspid Valve: The tricuspid valve is normal in structure. Tricuspid valve regurgitation is not demonstrated. No evidence of tricuspid stenosis. Aortic Valve: The aortic valve is normal in structure. There is mild calcification  of the aortic valve. There is mild thickening of the aortic valve. Aortic valve regurgitation is trivial. Aortic valve sclerosis/calcification is present, without any evidence of aortic stenosis. Aortic valve mean gradient measures 4.0 mmHg. Aortic valve peak gradient measures 8.1 mmHg. Aortic valve area, by VTI measures 3.41 cm. Pulmonic Valve: The pulmonic valve was normal in structure. Pulmonic valve regurgitation is not visualized. No evidence of pulmonic stenosis. Aorta: The aortic root is normal in size and structure. Venous: The inferior vena cava is normal in size with greater than 50% respiratory variability, suggesting right atrial pressure of 3 mmHg. IAS/Shunts: No atrial level shunt detected by color flow Doppler.  LEFT VENTRICLE PLAX 2D LVIDd:         5.20 cm   Diastology LVIDs:         4.00 cm   LV e' medial:    5.98 cm/s LV PW:         1.50 cm   LV E/e' medial:  15.5 LV IVS:        0.90 cm   LV e' lateral:   10.00 cm/s LVOT diam:     2.00 cm   LV E/e' lateral: 9.3 LV SV:         71 LV SV Index:   31 LVOT Area:     3.14 cm  RIGHT VENTRICLE RV S prime:     11.20 cm/s TAPSE (M-mode): 2.0 cm LEFT ATRIUM              Index        RIGHT ATRIUM           Index LA Vol (A2C):   109.0 ml 47.84 ml/m  RA Area:     11.70 cm LA Vol (A4C):   76.3 ml  33.49 ml/m  RA Volume:   28.40 ml  12.46 ml/m LA Biplane Vol: 91.4 ml  40.12 ml/m  AORTIC VALVE AV Area (Vmax):    2.90 cm AV Area (Vmean):   3.60 cm AV Area (VTI):     3.41 cm AV Vmax:           142.00 cm/s AV Vmean:          95.100 cm/s AV VTI:            0.209 m AV Peak Grad:      8.1 mmHg AV Mean Grad:      4.0 mmHg LVOT Vmax:         131.00 cm/s LVOT Vmean:        109.000 cm/s LVOT VTI:          0.227 m  LVOT/AV VTI ratio: 1.09  AORTA Ao Root diam: 2.80 cm MITRAL VALVE MV Area (PHT): 4.71 cm    SHUNTS MV Decel Time: 161 msec    Systemic VTI:  0.23 m MV E velocity: 92.80 cm/s  Systemic Diam: 2.00 cm MV A velocity: 55.00 cm/s MV E/A ratio:  1.69 Kardie Tobb  DO Electronically signed by Dub Huntsman DO Signature Date/Time: 08/11/2024/11:33:12 AM    Final    CT Angio Chest Pulmonary Embolism (PE) W or WO Contrast Result Date: 08/11/2024 CLINICAL DATA:  Shortness of breath.  Cough.  Elevated D-dimer. EXAM: CT ANGIOGRAPHY CHEST WITH CONTRAST TECHNIQUE: Multidetector CT imaging of the chest was performed using the standard protocol during bolus administration of intravenous contrast. Multiplanar CT image reconstructions and MIPs were obtained to evaluate the vascular anatomy. RADIATION DOSE REDUCTION: This exam was performed according to the departmental dose-optimization program which includes automated exposure control, adjustment of the mA and/or kV according to patient size and/or use of iterative reconstruction technique. CONTRAST:  75mL OMNIPAQUE  IOHEXOL  350 MG/ML SOLN COMPARISON:  11/27/2019 FINDINGS: Cardiovascular: Satisfactory opacification of pulmonary arteries noted, and no pulmonary emboli identified. No evidence of thoracic aortic dissection or aneurysm. Mediastinum/Nodes: Mild right paratracheal, subcarinal, and bilateral hilar lymphadenopathy shows no significant change since prior exam. No new or increased sites of lymphadenopathy seen. Lungs/Pleura: New airspace consolidation is seen involving a majority of the left lower lobe, suspicious for pneumonia. No evidence of central endobronchial obstruction. Right lower lobe scarring shows no significant change compared to prior study. Upper abdomen: No acute findings. Musculoskeletal: No suspicious bone lesions identified. Review of the MIP images confirms the above findings. IMPRESSION: No evidence of pulmonary embolism. New airspace consolidation involving majority of the left lower lobe, suspicious for pneumonia. Recommend continued chest radiographic or CT follow-up to confirm resolution. Stable mild mediastinal and bilateral hilar lymphadenopathy. Electronically Signed   By: Norleen DELENA Kil M.D.   On:  08/11/2024 11:18   DG Chest Portable 1 View Result Date: 08/10/2024 CLINICAL DATA:  Chest pain EXAM: PORTABLE CHEST 1 VIEW COMPARISON:  03/07/2023 FINDINGS: Right lung grossly clear. Mild cardiomegaly. Patchy airspace opacities in the left mid to lower lung. No pneumothorax IMPRESSION: Patchy airspace opacities in the left mid to lower lung, possible pneumonia. Imaging follow-up to resolution is recommended. Electronically Signed   By: Luke Bun M.D.   On: 08/10/2024 21:21      Subjective: Denies any chest pain or shortness of breath today, reports with activity and using his flutter valve he is having phlegm coming out and is improving.  Discharge Exam: Vitals:   08/14/24 2030 08/15/24 0800  BP: (!) 142/81 127/73  Pulse: 93 82  Resp: 19 18  Temp: (!) 97.4 F (36.3 C) (!) 97.4 F (36.3 C)  SpO2: 96% 98%   Vitals:   08/14/24 1159 08/14/24 1630 08/14/24 2030 08/15/24 0800  BP: 131/71 (!) 144/84 (!) 142/81 127/73  Pulse: 85 88 93 82  Resp: 19 20 19 18   Temp: 97.7 F (36.5 C) (!) 97.3 F (36.3 C) (!) 97.4 F (36.3 C) (!) 97.4 F (36.3 C)  TempSrc: Oral Oral Oral Oral  SpO2:   96% 98%  Weight:      Height:        General: Pt is alert, awake, not in acute distress Cardiovascular: RRR, S1/S2 +, no rubs, no gallops Respiratory: CTA bilaterally, no wheezing, no rhonchi Abdominal: Soft, NT, ND, bowel sounds + Extremities: no edema, no cyanosis  The results of significant diagnostics from this hospitalization (including imaging, microbiology, ancillary and laboratory) are listed below for reference.     Microbiology: Recent Results (from the past 240 hours)  Resp panel by RT-PCR (RSV, Flu A&B, Covid) Anterior Nasal Swab     Status: None   Collection Time: 08/10/24  9:04 PM   Specimen: Anterior Nasal Swab  Result Value Ref Range Status   SARS Coronavirus 2 by RT PCR NEGATIVE NEGATIVE Final   Influenza A by PCR NEGATIVE NEGATIVE Final   Influenza B by PCR NEGATIVE  NEGATIVE Final    Comment: (NOTE) The Xpert Xpress SARS-CoV-2/FLU/RSV plus assay is intended as an aid in the diagnosis of influenza from Nasopharyngeal swab specimens and should not be used as a sole basis for treatment. Nasal washings and aspirates are unacceptable for Xpert Xpress SARS-CoV-2/FLU/RSV testing.  Fact Sheet for Patients: bloggercourse.com  Fact Sheet for Healthcare Providers: seriousbroker.it  This test is not yet approved or cleared by the [place]  FDA and has been authorized for detection and/or diagnosis of SARS-CoV-2 by FDA under an Emergency Use Authorization (EUA). This EUA will remain in effect (meaning this test can be used) for the duration of the COVID-19 declaration under Section 564(b)(1) of the Act, 21 U.S.C. section 360bbb-3(b)(1), unless the authorization is terminated or revoked.     Resp Syncytial Virus by PCR NEGATIVE NEGATIVE Final    Comment: (NOTE) Fact Sheet for Patients: bloggercourse.com  Fact Sheet for Healthcare Providers: seriousbroker.it  This test is not yet approved or cleared by the [place]  FDA and has been authorized for detection and/or diagnosis of SARS-CoV-2 by FDA under an Emergency Use Authorization (EUA). This EUA will remain in effect (meaning this test can be used) for the duration of the COVID-19 declaration under Section 564(b)(1) of the Act, 21 U.S.C. section 360bbb-3(b)(1), unless the authorization is terminated or revoked.  Performed at [facility], [address] [place], [place] [address]   Blood culture (routine x 2)     Status: None (Preliminary result)   Collection Time: 08/10/24 11:09 PM   Specimen: BLOOD  Result Value Ref Range Status   Specimen Description BLOOD LEFT ANTECUBITAL  Final   Special Requests   Final    BOTTLES DRAWN AEROBIC AND ANAEROBIC Blood Culture adequate volume    Culture   Final    NO GROWTH 3 DAYS Performed at [facility], [address], [place], [place] [address]    Report Status PENDING  Incomplete  Blood culture (routine x 2)     Status: None (Preliminary result)   Collection Time: 08/11/24  6:20 AM   Specimen: BLOOD  Result Value Ref Range Status   Specimen Description BLOOD BLOOD RIGHT HAND  Final   Special Requests   Final    BOTTLES DRAWN AEROBIC AND ANAEROBIC Blood Culture results may not be optimal due to an inadequate volume of blood received in culture bottles   Culture   Final    NO GROWTH 3 DAYS Performed at [facility], [address], [place], [place] [address]    Report Status PENDING  Incomplete     Labs: BNP (last 3 results) Recent Labs    08/11/24 0749  BNP 85.9   Basic Metabolic Panel: Recent Labs  Lab 08/10/24 2104 08/10/24 2309 08/11/24 0620 08/12/24 0356 08/13/24 0230  NA 134*  --  136 137 136  K 3.5  --  3.7 4.0 3.6  CL  100  --  100 101 102  CO2 24  --  23 24 24   GLUCOSE 111*  --  101* 116* 118*  BUN 11  --  10 8 8   CREATININE 1.17  --  1.18 1.35* 1.04  CALCIUM 8.1*  --  8.6* 8.6* 8.4*  MG  --  2.0 2.0  --   --   PHOS  --   --  3.8  --   --    Liver Function Tests: Recent Labs  Lab 08/10/24 2104 08/11/24 0620  AST 56* 52*  ALT 35 33  ALKPHOS 55 53  BILITOT 0.8 0.7  PROT 7.0 6.7  ALBUMIN 2.6* 2.5*   No results for input(s): LIPASE, AMYLASE in the last 168 hours. No results for input(s): AMMONIA in the last 168 hours. CBC: Recent Labs  Lab 08/10/24 2104 08/11/24 0620 08/13/24 0230  WBC 6.8 5.9 5.9  NEUTROABS 4.3 3.8  --   HGB 11.5* 11.5* 10.6*  HCT 34.3* 34.0* 30.6*  MCV 91.5 92.4 89.7  PLT 195 201 228   Cardiac Enzymes: Recent Labs  Lab 08/12/24 0356  CKTOTAL 640*   BNP: Invalid input(s): POCBNP CBG: No results for input(s): GLUCAP in the last 168 hours. D-Dimer No results for input(s): DDIMER in the last 72 hours. Hgb A1c No results for  input(s): HGBA1C in the last 72 hours. Lipid Profile No results for input(s): CHOL, HDL, LDLCALC, TRIG, CHOLHDL, LDLDIRECT in the last 72 hours. Thyroid  function studies No results for input(s): TSH, T4TOTAL, T3FREE, THYROIDAB in the last 72 hours.  Invalid input(s): FREET3 Anemia work up No results for input(s): VITAMINB12, FOLATE, FERRITIN, TIBC, IRON, RETICCTPCT in the last 72 hours. Urinalysis    Component Value Date/Time   COLORURINE YELLOW 08/11/2024 1335   APPEARANCEUR CLEAR 08/11/2024 1335   LABSPEC >1.046 (H) 08/11/2024 1335   PHURINE 7.0 08/11/2024 1335   GLUCOSEU NEGATIVE 08/11/2024 1335   HGBUR SMALL (A) 08/11/2024 1335   BILIRUBINUR NEGATIVE 08/11/2024 1335   KETONESUR NEGATIVE 08/11/2024 1335   PROTEINUR 30 (A) 08/11/2024 1335   NITRITE NEGATIVE 08/11/2024 1335   LEUKOCYTESUR NEGATIVE 08/11/2024 1335   Sepsis Labs Recent Labs  Lab 08/10/24 2104 08/11/24 0620 08/13/24 0230  WBC 6.8 5.9 5.9   Microbiology Recent Results (from the past 240 hours)  Resp panel by RT-PCR (RSV, Flu A&B, Covid) Anterior Nasal Swab     Status: None   Collection Time: 08/10/24  9:04 PM   Specimen: Anterior Nasal Swab  Result Value Ref Range Status   SARS Coronavirus 2 by RT PCR NEGATIVE NEGATIVE Final   Influenza A by PCR NEGATIVE NEGATIVE Final   Influenza B by PCR NEGATIVE NEGATIVE Final    Comment: (NOTE) The Xpert Xpress SARS-CoV-2/FLU/RSV plus assay is intended as an aid in the diagnosis of influenza from Nasopharyngeal swab specimens and should not be used as a sole basis for treatment. Nasal washings and aspirates are unacceptable for Xpert Xpress SARS-CoV-2/FLU/RSV testing.  Fact Sheet for Patients: bloggercourse.com  Fact Sheet for Healthcare Providers: seriousbroker.it  This test is not yet approved or cleared by the [place]  FDA and has been authorized for detection and/or  diagnosis of SARS-CoV-2 by FDA under an Emergency Use Authorization (EUA). This EUA will remain in effect (meaning this test can be used) for the duration of the COVID-19 declaration under Section 564(b)(1) of the Act, 21 U.S.C. section 360bbb-3(b)(1), unless the authorization is terminated or revoked.     Resp Syncytial Virus  by PCR NEGATIVE NEGATIVE Final    Comment: (NOTE) Fact Sheet for Patients: bloggercourse.com  Fact Sheet for Healthcare Providers: seriousbroker.it  This test is not yet approved or cleared by the [place]  FDA and has been authorized for detection and/or diagnosis of SARS-CoV-2 by FDA under an Emergency Use Authorization (EUA). This EUA will remain in effect (meaning this test can be used) for the duration of the COVID-19 declaration under Section 564(b)(1) of the Act, 21 U.S.C. section 360bbb-3(b)(1), unless the authorization is terminated or revoked.  Performed at [facility], [address] [place], [place] [address]   Blood culture (routine x 2)     Status: None (Preliminary result)   Collection Time: 08/10/24 11:09 PM   Specimen: BLOOD  Result Value Ref Range Status   Specimen Description BLOOD LEFT ANTECUBITAL  Final   Special Requests   Final    BOTTLES DRAWN AEROBIC AND ANAEROBIC Blood Culture adequate volume   Culture   Final    NO GROWTH 3 DAYS Performed at [facility], [address], [place], [place] [address]    Report Status PENDING  Incomplete  Blood culture (routine x 2)     Status: None (Preliminary result)   Collection Time: 08/11/24  6:20 AM   Specimen: BLOOD  Result Value Ref Range Status   Specimen Description BLOOD BLOOD RIGHT HAND  Final   Special Requests   Final    BOTTLES DRAWN AEROBIC AND ANAEROBIC Blood Culture results may not be optimal due to an inadequate volume of blood received in culture bottles   Culture   Final    NO GROWTH 3 DAYS Performed at  [facility], [address], [place], [place] [address]    Report Status PENDING  Incomplete     Time coordinating discharge: Over 30 minutes  SIGNED:   Brayton Lye, MD  [facility] 08/15/2024, 8:22 AM Pager   If 7PM-7AM, please contact night-coverage [email or web address]

## 2024-08-16 LAB — CULTURE, BLOOD (ROUTINE X 2)
Culture: NO GROWTH
Culture: NO GROWTH
Special Requests: ADEQUATE

## 2024-08-22 NOTE — Progress Notes (Incomplete)
 Cardiology Office Note:   Date:  08/22/2024  ID:  Leonard Sweeney, DOB [birth year], MRN [id number] PCP: Pcp, No  [facility] Providers Cardiologist:  None { Chief Complaint: No chief complaint on file.     History of Present Illness:   Leonard Sweeney is a 53 y.o. adult with a PMH of cocaine abuse and obesity who presents as a new patient referral by Dr. Sherlon for the evaluation of abnormal echocardiogram.  The patient was recently hospitalized at [facility] from 11/12-11/17 after developing acute hypoxic respiratory failure secondary to sepsis from CAP.  He was treated with appropriate antibiotic therapy.  CT PE was negative for PE but showed left lower lobe consolidation consistent with PNA.  Troponins were collected during the hospitalization and were found to be mildly elevated but flat 36 -> 43 -> 35.  A TTE was performed which showed an EF of 45-50% prompting referral today.   Past Medical History:  Diagnosis Date   Anxiety    Depression      Studies Reviewed:    EKG: ***       Cardiac Studies & Procedures   ______________________________________________________________________________________________     ECHOCARDIOGRAM  ECHOCARDIOGRAM COMPLETE 08/11/2024  Narrative ECHOCARDIOGRAM REPORT    Patient Name:   Leonard Sweeney Date of Exam: 08/11/2024 Medical Rec #:  [id number]    Height:       71.0 in Accession #:    [id number]   Weight:       240.0 lb Date of Birth:  09-10-1971    BSA:          2.278 m Patient Age:    53 years     BP:           138/80 mmHg Patient Gender: M            HR:           94 bpm. Exam Location:  Inpatient  Procedure: 2D Echo, Cardiac Doppler and Color Doppler (Both Spectral and Color Flow Doppler were utilized during procedure).  Indications:    Chest Pain R07.9  History:        Patient has no prior history of Echocardiogram examinations.  Sonographer:    Jayson Gaskins Referring Phys: [id number] JUSTIN B  HOWERTER  IMPRESSIONS   1. Left ventricular ejection fraction, by estimation, is 45 to 50%. The left ventricle has mildly decreased function. The left ventricle demonstrates regional wall motion abnormalities (see scoring diagram/findings for description). There is mild asymmetric left ventricular hypertrophy of the inferior segment. Left ventricular diastolic parameters are indeterminate. 2. Right ventricular systolic function is normal. The right ventricular size is normal. Tricuspid regurgitation signal is inadequate for assessing PA pressure. 3. Left atrial size was mildly dilated. 4. The mitral valve is normal in structure. No evidence of mitral valve regurgitation. No evidence of mitral stenosis. Moderate mitral annular calcification. 5. The aortic valve is normal in structure. There is mild calcification of the aortic valve. There is mild thickening of the aortic valve. Aortic valve regurgitation is trivial. Aortic valve sclerosis/calcification is present, without any evidence of aortic stenosis. 6. The inferior vena cava is normal in size with greater than 50% respiratory variability, suggesting right atrial pressure of 3 mmHg.  FINDINGS Left Ventricle: Left ventricular ejection fraction, by estimation, is 45 to 50%. The left ventricle has mildly decreased function. The left ventricle demonstrates regional wall motion abnormalities. The left ventricular internal cavity size was normal in size. There is mild asymmetric  left ventricular hypertrophy of the inferior segment. Left ventricular diastolic parameters are indeterminate.   LV Wall Scoring: The posterior wall and basal inferior segment are akinetic. The entire anterior wall, antero-lateral wall, entire septum, entire apex, and mid and distal inferior wall are hypokinetic.  Right Ventricle: The right ventricular size is normal. No increase in right ventricular wall thickness. Right ventricular systolic function is normal.  Tricuspid regurgitation signal is inadequate for assessing PA pressure.  Left Atrium: Left atrial size was mildly dilated.  Right Atrium: Right atrial size was normal in size.  Pericardium: There is no evidence of pericardial effusion.  Mitral Valve: The mitral valve is normal in structure. Moderate mitral annular calcification. No evidence of mitral valve regurgitation. No evidence of mitral valve stenosis.  Tricuspid Valve: The tricuspid valve is normal in structure. Tricuspid valve regurgitation is not demonstrated. No evidence of tricuspid stenosis.  Aortic Valve: The aortic valve is normal in structure. There is mild calcification of the aortic valve. There is mild thickening of the aortic valve. Aortic valve regurgitation is trivial. Aortic valve sclerosis/calcification is present, without any evidence of aortic stenosis. Aortic valve mean gradient measures 4.0 mmHg. Aortic valve peak gradient measures 8.1 mmHg. Aortic valve area, by VTI measures 3.41 cm.  Pulmonic Valve: The pulmonic valve was normal in structure. Pulmonic valve regurgitation is not visualized. No evidence of pulmonic stenosis.  Aorta: The aortic root is normal in size and structure.  Venous: The inferior vena cava is normal in size with greater than 50% respiratory variability, suggesting right atrial pressure of 3 mmHg.  IAS/Shunts: No atrial level shunt detected by color flow Doppler.   LEFT VENTRICLE PLAX 2D LVIDd:         5.20 cm   Diastology LVIDs:         4.00 cm   LV e' medial:    5.98 cm/s LV PW:         1.50 cm   LV E/e' medial:  15.5 LV IVS:        0.90 cm   LV e' lateral:   10.00 cm/s LVOT diam:     2.00 cm   LV E/e' lateral: 9.3 LV SV:         71 LV SV Index:   31 LVOT Area:     3.14 cm   RIGHT VENTRICLE RV S prime:     11.20 cm/s TAPSE (M-mode): 2.0 cm  LEFT ATRIUM              Index        RIGHT ATRIUM           Index LA Vol (A2C):   109.0 ml 47.84 ml/m  RA Area:     11.70 cm LA Vol  (A4C):   76.3 ml  33.49 ml/m  RA Volume:   28.40 ml  12.46 ml/m LA Biplane Vol: 91.4 ml  40.12 ml/m AORTIC VALVE AV Area (Vmax):    2.90 cm AV Area (Vmean):   3.60 cm AV Area (VTI):     3.41 cm AV Vmax:           142.00 cm/s AV Vmean:          95.100 cm/s AV VTI:            0.209 m AV Peak Grad:      8.1 mmHg AV Mean Grad:      4.0 mmHg LVOT Vmax:  131.00 cm/s LVOT Vmean:        109.000 cm/s LVOT VTI:          0.227 m LVOT/AV VTI ratio: 1.09  AORTA Ao Root diam: 2.80 cm  MITRAL VALVE MV Area (PHT): 4.71 cm    SHUNTS MV Decel Time: 161 msec    Systemic VTI:  0.23 m MV E velocity: 92.80 cm/s  Systemic Diam: 2.00 cm MV A velocity: 55.00 cm/s MV E/A ratio:  1.69  Kardie Tobb DO Electronically signed by Dub Huntsman DO Signature Date/Time: 08/11/2024/11:33:12 AM    Final          ______________________________________________________________________________________________      Risk Assessment/Calculations:   {Does this patient have ATRIAL FIBRILLATION?:[id number]} No BP recorded.  {Refresh Note OR Click here to enter BP  :1}***        Physical Exam:     VS:  There were no vitals taken for this visit. ***    Wt Readings from Last 3 Encounters:  08/14/24 224 lb 3.3 oz (101.7 kg)  05/08/23 205 lb 0.4 oz (93 kg)  03/07/23 205 lb (93 kg)     GEN: Well nourished, well developed, in no acute distress NECK: No JVD; No carotid bruits CARDIAC: ***RRR, no murmurs, rubs, gallops RESPIRATORY:  Clear to auscultation without rales, wheezing or rhonchi  ABDOMEN: Soft, non-tender, non-distended, normal bowel sounds EXTREMITIES:  Warm and well perfused, no edema; No deformity, 2+ radial pulses PSYCH: Normal mood and affect   Assessment & Plan       {Are you ordering a CV Procedure (e.g. stress test, cath, DCCV, TEE, etc)?   Press F2        :[id number]}   This note was written with the assistance of a dictation microphone or AI dictation software. Please  excuse any typos or grammatical errors.   Signed, Georganna Archer, MD 08/22/2024 5:24 PM    [facility]

## 2024-08-23 ENCOUNTER — Ambulatory Visit
Payer: Self-pay | Attending: Student in an Organized Health Care Education/Training Program | Admitting: Student in an Organized Health Care Education/Training Program

## 2024-11-03 ENCOUNTER — Other Ambulatory Visit: Payer: Self-pay

## 2024-11-03 ENCOUNTER — Emergency Department (HOSPITAL_COMMUNITY): Payer: Self-pay

## 2024-11-03 ENCOUNTER — Encounter (HOSPITAL_COMMUNITY): Payer: Self-pay | Admitting: Emergency Medicine

## 2024-11-03 ENCOUNTER — Inpatient Hospital Stay (HOSPITAL_COMMUNITY)
Admission: EM | Admit: 2024-11-03 | Payer: Self-pay | Source: Home / Self Care | Attending: Family Medicine | Admitting: Family Medicine

## 2024-11-03 DIAGNOSIS — I5023 Acute on chronic systolic (congestive) heart failure: Secondary | ICD-10-CM | POA: Diagnosis present

## 2024-11-03 DIAGNOSIS — J101 Influenza due to other identified influenza virus with other respiratory manifestations: Secondary | ICD-10-CM | POA: Diagnosis present

## 2024-11-03 DIAGNOSIS — I509 Heart failure, unspecified: Principal | ICD-10-CM

## 2024-11-03 DIAGNOSIS — R7989 Other specified abnormal findings of blood chemistry: Secondary | ICD-10-CM | POA: Diagnosis present

## 2024-11-03 DIAGNOSIS — F1410 Cocaine abuse, uncomplicated: Secondary | ICD-10-CM | POA: Diagnosis present

## 2024-11-03 HISTORY — DX: Chronic systolic (congestive) heart failure: I50.22

## 2024-11-03 LAB — CBC WITH DIFFERENTIAL/PLATELET
Abs Immature Granulocytes: 0.03 10*3/uL (ref 0.00–0.07)
Basophils Absolute: 0 10*3/uL (ref 0.0–0.1)
Basophils Relative: 0 %
Eosinophils Absolute: 0.1 10*3/uL (ref 0.0–0.5)
Eosinophils Relative: 1 %
HCT: 38 % — ABNORMAL LOW (ref 39.0–52.0)
Hemoglobin: 12.7 g/dL — ABNORMAL LOW (ref 13.0–17.0)
Immature Granulocytes: 0 %
Lymphocytes Relative: 13 %
Lymphs Abs: 1.2 10*3/uL (ref 0.7–4.0)
MCH: 31.1 pg (ref 26.0–34.0)
MCHC: 33.4 g/dL (ref 30.0–36.0)
MCV: 93.1 fL (ref 80.0–100.0)
Monocytes Absolute: 0.7 10*3/uL (ref 0.1–1.0)
Monocytes Relative: 8 %
Neutro Abs: 7.1 10*3/uL (ref 1.7–7.7)
Neutrophils Relative %: 78 %
Platelets: 399 10*3/uL (ref 150–400)
RBC: 4.08 MIL/uL — ABNORMAL LOW (ref 4.22–5.81)
RDW: 15.1 % (ref 11.5–15.5)
WBC: 9.2 10*3/uL (ref 4.0–10.5)
nRBC: 0 % (ref 0.0–0.2)

## 2024-11-03 LAB — RESP PANEL BY RT-PCR (RSV, FLU A&B, COVID)  RVPGX2
Influenza A by PCR: POSITIVE — AB
Influenza B by PCR: NEGATIVE
Resp Syncytial Virus by PCR: NEGATIVE
SARS Coronavirus 2 by RT PCR: NEGATIVE

## 2024-11-03 LAB — I-STAT CG4 LACTIC ACID, ED: Lactic Acid, Venous: 0.9 mmol/L (ref 0.5–1.9)

## 2024-11-03 MED ORDER — IPRATROPIUM-ALBUTEROL 0.5-2.5 (3) MG/3ML IN SOLN
3.0000 mL | Freq: Once | RESPIRATORY_TRACT | Status: AC
  Administered 2024-11-03: 3 mL via RESPIRATORY_TRACT
  Filled 2024-11-03: qty 3

## 2024-11-03 NOTE — ED Provider Notes (Signed)
 " [facility] DEPT [facility] Emergency Department Provider Note MRN:  [id number]  Arrival date & time: 11/04/24     Chief Complaint   Shortness of Breath   History of Present Illness   Leonard Sweeney is a 54 y.o. year-old adult presents to the ED with chief complaint of shortness of breath.  States that he has had worsening symptoms over the past several days.  Reports significant swelling of the lower extremities.  States that he was seen a while ago for pneumonia.  States he took the antibiotics as directed.  He denies fevers or chills.  Reports worsening shortness of breath with exertion and with lying down.  He states that he still uses cocaine.  History provided by patient.   Review of Systems  Pertinent positive and negative review of systems noted in HPI.    Physical Exam   Vitals:   11/04/24 0015 11/04/24 0100  BP: (!) 168/104 (!) 145/91  Pulse: (!) 104 95  Resp: (!) 24 (!) 24  Temp:    SpO2: 100% 100%    CONSTITUTIONAL:  SOB-appearing, NEURO:  Alert and oriented x 3, CN 3-12 grossly intact EYES:  eyes equal and reactive ENT/NECK:  Supple, no stridor  CARDIO:  tachycardic, regular rhythm, appears well-perfused  PULM:  moderate respiratory distress, increased WOB, speaks in 2-3 word sentences, crackles in bases GI/GU:  non-distended, non tender MSK/SPINE:  No gross deformities, 3+pitting edema, moves all extremities  SKIN:  no rash, atraumatic   *Additional and/or pertinent findings included in MDM below  Diagnostic and Interventional Summary    EKG Interpretation Date/Time:  Thursday November 03 2024 22:07:31 EST Ventricular Rate:  111 PR Interval:  164 QRS Duration:  106 QT Interval:  338 QTC Calculation: 459 R Axis:   -26  Text Interpretation: Sinus tachycardia Left ventricular hypertrophy Non-specific ST-t changes Confirmed by Bernard Drivers ([id number]) on 11/03/2024 10:10:27 PM       Labs Reviewed  RESP PANEL BY RT-PCR (RSV, FLU A&B, COVID)   RVPGX2 - Abnormal; Notable for the following components:      Result Value   Influenza A by PCR POSITIVE (*)    All other components within normal limits  BASIC METABOLIC PANEL WITH GFR - Abnormal; Notable for the following components:   Glucose, Bld 115 (*)    All other components within normal limits  CBC WITH DIFFERENTIAL/PLATELET - Abnormal; Notable for the following components:   RBC 4.08 (*)    Hemoglobin 12.7 (*)    HCT 38.0 (*)    All other components within normal limits  PRO BRAIN NATRIURETIC PEPTIDE - Abnormal; Notable for the following components:   Pro Brain Natriuretic Peptide 4,210.0 (*)    All other components within normal limits  TROPONIN T, HIGH SENSITIVITY - Abnormal; Notable for the following components:   Troponin T High Sensitivity 74 (*)    All other components within normal limits  CULTURE, BLOOD (ROUTINE X 2)  CULTURE, BLOOD (ROUTINE X 2)  URINE DRUG SCREEN  I-STAT CG4 LACTIC ACID, ED  TROPONIN T, HIGH SENSITIVITY    DG Chest Port 1 View  Final Result      Medications  oseltamivir  (TAMIFLU ) capsule 75 mg (has no administration in time range)  furosemide  (LASIX ) injection 40 mg (has no administration in time range)  ipratropium-albuterol  (DUONEB) 0.5-2.5 (3) MG/3ML nebulizer solution 3 mL (3 mLs Nebulization Given 11/03/24 2317)     Procedures  /  Critical Care .Critical Care  Performed by:  Vicky Charleston, PA-C Authorized by: Vicky Charleston, PA-C   Critical care provider statement:    Critical care time (minutes):  46   Critical care was necessary to treat or prevent imminent or life-threatening deterioration of the following conditions:  Respiratory failure   Critical care was time spent personally by me on the following activities:  Development of treatment plan with patient or surrogate, discussions with consultants, evaluation of patient's response to treatment, examination of patient, ordering and review of laboratory studies, ordering and  review of radiographic studies, ordering and performing treatments and interventions, pulse oximetry, re-evaluation of patient's condition and review of old charts   ED Course and Medical Decision Making  I have reviewed the triage vital signs, the nursing notes, and pertinent available records from the EMR.  Social Determinants Affecting Complexity of Care: Patient has no clinically significant social determinants affecting this chief complaint..   ED Course:    Medical Decision Making Patient here with significant lower extremity edema, shortness of breath, and cough.  Symptoms have been worsening for the past several days.  States that he has never really felt any better since he was diagnosed with pneumonia, but chart review shows that this was several months ago.  He does endorse daily cocaine use.  I suspect that this is contributing.  He does appear very short of breath, and I do think that he would benefit from starting BiPAP.  Patient reassessed after on BiPAP, he is breathing much easier, tolerating the BiPAP well.  Labs are notable for elevated troponin at 74, but no acute ischemic EKG changes, I suspect this is likely secondary to demand ischemia.  proBNP is 4210.  Chest x-ray is also consistent with CHF presentation.  Additionally, patient is noted to be influenza A positive.  Will start Lasix , will start Tamiflu .  Patient will need admission to the hospital.  Risk Prescription drug management. Decision regarding hospitalization.         Consultants: I consulted with Hospitalist, Dr. Charlton, who is appreciated for admitting.   Treatment and Plan: Patient's exam and diagnostic results are concerning for new onset CHF.  Feel that patient will need admission to the hospital for further treatment and evaluation.    Final Clinical Impressions(s) / ED Diagnoses     ICD-10-CM   1. Acute on chronic congestive heart failure, unspecified heart failure type [facility])   I50.9       ED Discharge Orders     None         Discharge Instructions Discussed with and Provided to Patient:   Discharge Instructions   None      Vicky Charleston, PA-C 11/04/24 0146    Theadore Ozell HERO, MD 11/04/24 [phone]  "

## 2024-11-03 NOTE — ED Provider Triage Note (Signed)
 Emergency Medicine Provider Triage Evaluation Note  Essa Malachi , a 54 y.o. adult  was evaluated in triage.  Pt complains of resp distress, speaking in short phrases, states recently diagnosed with pneumonia, legs swollen.  Review of Systems  Positive:  Negative:   Physical Exam  BP (!) 192/108 (BP Location: Right Arm)   Pulse (!) 113   Temp 98.1 F (36.7 C) (Oral)   Resp 16   Wt 102 kg   SpO2 97%   BMI 31.36 kg/m  Gen:   Awake, in distress, appears tired   Resp:  Increased effort, diminished lung sounds, +wheezing MSK:   Pitting edema bilateral lower extremities  Other:    Medical Decision Making  Medically screening exam initiated at 10:05 PM.  Appropriate orders placed.  [name] was informed that the remainder of the evaluation will be completed by another provider, this initial triage assessment does not replace that evaluation, and the importance of remaining in the ED until their evaluation is complete.     Beverley Leita LABOR, PA-C 11/03/24 2207

## 2024-11-03 NOTE — ED Triage Notes (Addendum)
 Patient reports he has been battling pna for a while and has not been taking abx and has been taking his inhaler without relief.  Patient speaking in 2-3 word sentences. Patient has pitting edema in bilateral lower legs.

## 2024-11-04 ENCOUNTER — Encounter (HOSPITAL_COMMUNITY): Payer: Self-pay | Admitting: Family Medicine

## 2024-11-04 DIAGNOSIS — I5023 Acute on chronic systolic (congestive) heart failure: Secondary | ICD-10-CM | POA: Diagnosis present

## 2024-11-04 DIAGNOSIS — J101 Influenza due to other identified influenza virus with other respiratory manifestations: Secondary | ICD-10-CM | POA: Diagnosis present

## 2024-11-04 LAB — CULTURE, BLOOD (ROUTINE X 2)
Culture: NO GROWTH
Culture: NO GROWTH
Special Requests: ADEQUATE
Special Requests: ADEQUATE

## 2024-11-04 LAB — CBC
HCT: 32.8 % — ABNORMAL LOW (ref 39.0–52.0)
Hemoglobin: 11 g/dL — ABNORMAL LOW (ref 13.0–17.0)
MCH: 31.3 pg (ref 26.0–34.0)
MCHC: 33.5 g/dL (ref 30.0–36.0)
MCV: 93.2 fL (ref 80.0–100.0)
Platelets: 336 10*3/uL (ref 150–400)
RBC: 3.52 MIL/uL — ABNORMAL LOW (ref 4.22–5.81)
RDW: 15.2 % (ref 11.5–15.5)
WBC: 10.4 10*3/uL (ref 4.0–10.5)
nRBC: 0 % (ref 0.0–0.2)

## 2024-11-04 LAB — BASIC METABOLIC PANEL WITH GFR
Anion gap: 12 (ref 5–15)
Anion gap: 7 (ref 5–15)
BUN: 9 mg/dL (ref 6–20)
BUN: 8 mg/dL (ref 6–20)
CO2: 26 mmol/L (ref 22–32)
CO2: 29 mmol/L (ref 22–32)
Calcium: 9.3 mg/dL (ref 8.9–10.3)
Calcium: 8.8 mg/dL — ABNORMAL LOW (ref 8.9–10.3)
Chloride: 101 mmol/L (ref 98–111)
Chloride: 103 mmol/L (ref 98–111)
Creatinine, Ser: 1.23 mg/dL (ref 0.61–1.24)
Creatinine, Ser: 1.23 mg/dL (ref 0.61–1.24)
GFR, Estimated: >60 mL/min
GFR, Estimated: >60 mL/min
Glucose, Bld: 115 mg/dL — ABNORMAL HIGH (ref 70–99)
Glucose, Bld: 99 mg/dL (ref 70–99)
Potassium: 3.9 mmol/L (ref 3.5–5.1)
Potassium: 3.7 mmol/L (ref 3.5–5.1)
Sodium: 139 mmol/L (ref 135–145)
Sodium: 139 mmol/L (ref 135–145)

## 2024-11-04 LAB — HIV ANTIBODY (ROUTINE TESTING W REFLEX): HIV Screen 4th Generation wRfx: NONREACTIVE

## 2024-11-04 LAB — PRO BRAIN NATRIURETIC PEPTIDE: Pro Brain Natriuretic Peptide: 4210 pg/mL — ABNORMAL HIGH

## 2024-11-04 LAB — URINE DRUG SCREEN
Amphetamines: NEGATIVE
Barbiturates: NEGATIVE
Benzodiazepines: NEGATIVE
Cocaine: POSITIVE — AB
Fentanyl: NEGATIVE
Methadone Scn, Ur: NEGATIVE
Opiates: NEGATIVE
Tetrahydrocannabinol: NEGATIVE

## 2024-11-04 LAB — TROPONIN T, HIGH SENSITIVITY
Troponin T High Sensitivity: 74 ng/L — ABNORMAL HIGH (ref 0–19)
Troponin T High Sensitivity: 60 ng/L — ABNORMAL HIGH (ref 0–19)

## 2024-11-04 LAB — MAGNESIUM: Magnesium: 2.2 mg/dL (ref 1.7–2.4)

## 2024-11-04 MED ORDER — SODIUM CHLORIDE 0.9% FLUSH
3.0000 mL | Freq: Two times a day (BID) | INTRAVENOUS | Status: AC
  Administered 2024-11-04 (×3): 3 mL via INTRAVENOUS

## 2024-11-04 MED ORDER — OSELTAMIVIR PHOSPHATE 75 MG PO CAPS: 75.0000 mg | ORAL_CAPSULE | Freq: Once | ORAL | Status: DC

## 2024-11-04 MED ORDER — ONDANSETRON HCL 4 MG/2ML IJ SOLN: 4.0000 mg | Freq: Four times a day (QID) | INTRAMUSCULAR | Status: AC | PRN

## 2024-11-04 MED ORDER — ENOXAPARIN SODIUM 40 MG/0.4ML IJ SOSY
40.0000 mg | PREFILLED_SYRINGE | Freq: Every day | INTRAMUSCULAR | Status: AC
  Administered 2024-11-04: 40 mg via SUBCUTANEOUS
  Filled 2024-11-04: qty 0.4

## 2024-11-04 MED ORDER — FUROSEMIDE 10 MG/ML IJ SOLN: 40.0000 mg | INTRAMUSCULAR | Status: DC

## 2024-11-04 MED ORDER — ACETAMINOPHEN 325 MG PO TABS: 650.0000 mg | ORAL_TABLET | Freq: Four times a day (QID) | ORAL | Status: AC | PRN

## 2024-11-04 MED ORDER — IPRATROPIUM-ALBUTEROL 0.5-2.5 (3) MG/3ML IN SOLN: 3.0000 mL | RESPIRATORY_TRACT | Status: AC | PRN

## 2024-11-04 MED ORDER — OXYCODONE HCL 5 MG PO TABS: 5.0000 mg | ORAL_TABLET | ORAL | Status: AC | PRN

## 2024-11-04 MED ORDER — ACETAMINOPHEN 650 MG RE SUPP: 650.0000 mg | Freq: Four times a day (QID) | RECTAL | Status: AC | PRN

## 2024-11-04 MED ORDER — ASPIRIN 81 MG PO TBEC
81.0000 mg | DELAYED_RELEASE_TABLET | Freq: Every day | ORAL | Status: AC
  Administered 2024-11-04: 81 mg via ORAL
  Filled 2024-11-04: qty 1

## 2024-11-04 MED ORDER — ONDANSETRON HCL 4 MG PO TABS: 4.0000 mg | ORAL_TABLET | Freq: Four times a day (QID) | ORAL | Status: AC | PRN

## 2024-11-04 MED ORDER — POLYETHYLENE GLYCOL 3350 17 G PO PACK: 17.0000 g | PACK | Freq: Every day | ORAL | Status: AC | PRN

## 2024-11-04 MED ORDER — FUROSEMIDE 10 MG/ML IJ SOLN
40.0000 mg | Freq: Two times a day (BID) | INTRAMUSCULAR | Status: AC
  Administered 2024-11-04 (×3): 40 mg via INTRAVENOUS
  Filled 2024-11-04 (×3): qty 4

## 2024-11-04 MED ORDER — LABETALOL HCL 5 MG/ML IV SOLN
10.0000 mg | INTRAVENOUS | Status: AC | PRN
  Administered 2024-11-04: 10 mg via INTRAVENOUS
  Filled 2024-11-04: qty 4

## 2024-11-04 MED ORDER — OSELTAMIVIR PHOSPHATE 75 MG PO CAPS
75.0000 mg | ORAL_CAPSULE | Freq: Two times a day (BID) | ORAL | Status: AC
  Administered 2024-11-04 (×2): 75 mg via ORAL
  Filled 2024-11-04 (×2): qty 1

## 2024-11-04 NOTE — Progress Notes (Signed)
 Subjective: Patient admitted this morning, see detailed H&P by Dr Charlton 54 y.o. adult with medical history significant for cocaine abuse, depression, anxiety, and chronic HFmrEF who presents with cough and shortness of breath.   Patient reports worsening shortness of breath and cough over the past few days.  Cough is nonproductive.  He has also developed bilateral lower extremity edema and orthopnea.  He denies fevers or chills.   ED Course: Upon arrival to the ED, patient is found to be afebrile and saturating well on BiPAP with tachypnea, tachycardia, and elevated blood pressure.  Labs are most notable for normal positive influenza A PCR, BNP, normal WBC, normal lactic acid, troponin 74, and proBNP 4210.  Chest x-ray is notable for enlarged cardiac silhouette and pulmonary edema.   Blood cultures were collected in the ED, patient was placed on BiPAP, and he was treated with DuoN  Scheduled Meds:  aspirin  EC  81 mg Oral Daily   enoxaparin  (LOVENOX ) injection  40 mg Subcutaneous Daily   furosemide   40 mg Intravenous BID   oseltamivir   75 mg Oral BID   sodium chloride  flush  3 mL Intravenous Q12H   Continuous Infusions: PRN Meds:.acetaminophen  **OR** acetaminophen , ipratropium-albuterol , ondansetron  **OR** ondansetron  (ZOFRAN ) IV, oxyCODONE , polyethylene glycol   A/P  1. Acute on chronic HFmrEF  - EF was 45-50% on echo from November 2025  - Continue diuresis with IV Lasix , monitor weight and I/Os, continue BiPAP as-needed     2. Influenza A  - Start Tamiflu , use droplet precautions, supportive care     3. Elevated troponin  - Likely demand ischemia in setting of acute CHF with acute respiratory distress ; also positive cocaine on urine drug screen - Trend troponin, continue cardiac monitoring, treat underlying CHF and flu   - Troponin-74, 60  4.  Polysubstance abuse -UDS positive for cocaine -Will consult TOC for outpatient rehab information     Sabas GORMAN Brod [facility]

## 2024-11-04 NOTE — Progress Notes (Incomplete)
 [facility] night cross cover note:   I was notified by the patient's RN  ***  Elevated blood pressure and heart rate in the setting of acute on chronic heart failure, no PRNs.  Undergoing IV diuresis via IV Lasix .  I subsequently added as needed IV labetalol  for systolic blood pressure greater than 170.  ***   Eva Pore, DO Hospitalist

## 2024-11-04 NOTE — ED Notes (Signed)
 Patient alert to voice, presents lethargic. Patient did not wish to get more comfortable by removing his pants and put on a gown, stated leave it and do it later.

## 2024-11-04 NOTE — ED Notes (Signed)
 Patient is resting comfortably.

## 2024-11-04 NOTE — ED Notes (Signed)
 Patient resting in bed. Patient had removed his Delano with Spo2 96%. Patient denied pain stated I'm okay. Left in position of comfort.

## 2024-11-04 NOTE — ED Notes (Signed)
 3E notified of patient heading to them.

## 2024-11-04 NOTE — ED Notes (Signed)
 Patient had removed leads and BP cuff, continues to remove oxygen. Easy to arouse but continues to present lethargic during attempt to assess.

## 2024-11-04 NOTE — H&P (Signed)
 " History and Physical    Leonard Sweeney FMW:[id number] DOB: 03/20/1971 DOA: 11/03/2024  PCP: Pcp, No   Patient coming from: Home   Chief Complaint: SOB, cough, leg swelling  HPI: Leonard Sweeney is a 54 y.o. adult with medical history significant for cocaine abuse, depression, anxiety, and chronic HFmrEF who presents with cough and shortness of breath.  Patient reports worsening shortness of breath and cough over the past few days.  Cough is nonproductive.  He has also developed bilateral lower extremity edema and orthopnea.  He denies fevers or chills.  ED Course: Upon arrival to the ED, patient is found to be afebrile and saturating well on BiPAP with tachypnea, tachycardia, and elevated blood pressure.  Labs are most notable for normal positive influenza A PCR, BNP, normal WBC, normal lactic acid, troponin 74, and proBNP 4210.  Chest x-ray is notable for enlarged cardiac silhouette and pulmonary edema.  Blood cultures were collected in the ED, patient was placed on BiPAP, and he was treated with DuoNeb.  Review of Systems:  All other systems reviewed and apart from HPI, are negative.  Past Medical History:  Diagnosis Date   Anxiety    Chronic heart failure with mildly reduced ejection fraction (HFmrEF) (HCC)    Depression     History reviewed. No pertinent surgical history.  Social History:   reports that he has never smoked. He has never used smokeless tobacco. He reports that he does not currently use alcohol. He reports current drug use. Drug: Cocaine.  Allergies[1]  History reviewed. No pertinent family history.   Prior to Admission medications  Medication Sig Start Date End Date Taking? Authorizing Provider  acetaminophen  (TYLENOL ) 325 MG tablet Take 2 tablets (650 mg total) by mouth every 6 (six) hours as needed for mild pain (pain score 1-3) (or Fever >/= 101). 08/15/24   Elgergawy, Brayton RAMAN, MD  aspirin  EC 81 MG tablet Take 1 tablet (81 mg total) by mouth daily. Swallow  whole. 08/15/24   Elgergawy, Brayton RAMAN, MD    Physical Exam: Vitals:   11/03/24 2328 11/04/24 0015 11/04/24 0100 11/04/24 0151  BP:  (!) 168/104 (!) 145/91   Pulse:  (!) 104 95   Resp: (!) 25 (!) 24 (!) 24   Temp:    98 F (36.7 C)  TempSrc:    Oral  SpO2:  100% 100%   Weight:        Constitutional: NAD, no pallor or diaphoresis  Eyes: PERTLA, lids and conjunctivae normal ENMT: Mucous membranes are moist. Posterior pharynx clear of any exudate or lesions.   Neck: supple, no masses  Respiratory: Mild tachypnea. Fine rales. No wheezing.  Cardiovascular: S1 & S2 heard, regular rate and rhythm. Pretibial pitting edema bilaterally.  Abdomen: No tenderness, soft. Bowel sounds active.  Musculoskeletal: no clubbing / cyanosis. No joint deformity upper and lower extremities.   Skin: no significant rashes, lesions, ulcers. Warm, dry, well-perfused. Neurologic: CN 2-12 grossly intact. Moving all extremities. Alert and oriented.  Psychiatric: Calm. Cooperative.    Labs and Imaging on Admission: I have personally reviewed following labs and imaging studies  CBC: Recent Labs  Lab 11/03/24 2206  WBC 9.2  NEUTROABS 7.1  HGB 12.7*  HCT 38.0*  MCV 93.1  PLT 399   Basic Metabolic Panel: Recent Labs  Lab 11/03/24 2206  NA 139  K 3.9  CL 101  CO2 26  GLUCOSE 115*  BUN 9  CREATININE 1.23  CALCIUM 9.3   GFR: Estimated  Creatinine Clearance (by C-G formula based on SCr of 1.23 mg/dL) Male: 68 mL/min Male: 82.5 mL/min Liver Function Tests: No results for input(s): AST, ALT, ALKPHOS, BILITOT, PROT, ALBUMIN in the last 168 hours. No results for input(s): LIPASE, AMYLASE in the last 168 hours. No results for input(s): AMMONIA in the last 168 hours. Coagulation Profile: No results for input(s): INR, PROTIME in the last 168 hours. Cardiac Enzymes: No results for input(s): CKTOTAL, CKMB, CKMBINDEX, TROPONINI in the last 168 hours. BNP (last 3  results) Recent Labs    11/03/24 2206  PROBNP 4,210.0*   HbA1C: No results for input(s): HGBA1C in the last 72 hours. CBG: No results for input(s): GLUCAP in the last 168 hours. Lipid Profile: No results for input(s): CHOL, HDL, LDLCALC, TRIG, CHOLHDL, LDLDIRECT in the last 72 hours. Thyroid  Function Tests: No results for input(s): TSH, T4TOTAL, FREET4, T3FREE, THYROIDAB in the last 72 hours. Anemia Panel: No results for input(s): VITAMINB12, FOLATE, FERRITIN, TIBC, IRON, RETICCTPCT in the last 72 hours. Urine analysis:    Component Value Date/Time   COLORURINE YELLOW 08/11/2024 1335   APPEARANCEUR CLEAR 08/11/2024 1335   LABSPEC >1.046 (H) 08/11/2024 1335   PHURINE 7.0 08/11/2024 1335   GLUCOSEU NEGATIVE 08/11/2024 1335   HGBUR SMALL (A) 08/11/2024 1335   BILIRUBINUR NEGATIVE 08/11/2024 1335   KETONESUR NEGATIVE 08/11/2024 1335   PROTEINUR 30 (A) 08/11/2024 1335   NITRITE NEGATIVE 08/11/2024 1335   LEUKOCYTESUR NEGATIVE 08/11/2024 1335   Sepsis Labs: @LABRCNTIP (procalcitonin:4,lacticidven:4) ) Recent Results (from the past 240 hours)  Resp panel by RT-PCR (RSV, Flu A&B, Covid) Anterior Nasal Swab     Status: Abnormal   Collection Time: 11/03/24 10:05 PM   Specimen: Anterior Nasal Swab  Result Value Ref Range Status   SARS Coronavirus 2 by RT PCR NEGATIVE NEGATIVE Final   Influenza A by PCR POSITIVE (A) NEGATIVE Final   Influenza B by PCR NEGATIVE NEGATIVE Final    Comment: (NOTE) The Xpert Xpress SARS-CoV-2/FLU/RSV plus assay is intended as an aid in the diagnosis of influenza from Nasopharyngeal swab specimens and should not be used as a sole basis for treatment. Nasal washings and aspirates are unacceptable for Xpert Xpress SARS-CoV-2/FLU/RSV testing.  Fact Sheet for Patients: bloggercourse.com  Fact Sheet for Healthcare Providers: seriousbroker.it  This test is not yet  approved or cleared by the [place]  FDA and has been authorized for detection and/or diagnosis of SARS-CoV-2 by FDA under an Emergency Use Authorization (EUA). This EUA will remain in effect (meaning this test can be used) for the duration of the COVID-19 declaration under Section 564(b)(1) of the Act, 21 U.S.C. section 360bbb-3(b)(1), unless the authorization is terminated or revoked.     Resp Syncytial Virus by PCR NEGATIVE NEGATIVE Final    Comment: (NOTE) Fact Sheet for Patients: bloggercourse.com  Fact Sheet for Healthcare Providers: seriousbroker.it  This test is not yet approved or cleared by the [place]  FDA and has been authorized for detection and/or diagnosis of SARS-CoV-2 by FDA under an Emergency Use Authorization (EUA). This EUA will remain in effect (meaning this test can be used) for the duration of the COVID-19 declaration under Section 564(b)(1) of the Act, 21 U.S.C. section 360bbb-3(b)(1), unless the authorization is terminated or revoked.  Performed at [facility], [address] [place], [place] [address]      Radiological Exams on Admission: DG Chest Port 1 View Result Date: 11/03/2024 CLINICAL DATA:  Short of breath EXAM: PORTABLE CHEST 1 VIEW COMPARISON:  08/10/2024 FINDINGS: Single frontal view of the chest demonstrates an enlarged cardiac silhouette. There is increased pulmonary vascular congestion, with patchy bilateral airspace disease greatest at the left lung base concerning for edema. No effusion or pneumothorax. No acute bony abnormalities. IMPRESSION: 1. Constellation of findings most consistent with congestive heart failure and pulmonary edema. Electronically Signed   By: Ozell Daring M.D.   On: 11/03/2024 22:53    EKG: Independently reviewed. Sinus tachycardia, rate 111, LVH.   Assessment/Plan   1. Acute on chronic HFmrEF  - EF was 45-50% on echo from November 2025  -  Continue diuresis with IV Lasix , monitor weight and I/Os, continue BiPAP as-needed    2. Influenza A  - Start Tamiflu , use droplet precautions, supportive care    3. Elevated troponin  - Likely demand ischemia in setting of acute CHF with acute respiratory distress  - Trend troponin, continue cardiac monitoring, treat underlying CHF and flu     DVT prophylaxis: Lovenox   Code Status: Full  Level of Care: Level of care: Progressive Family Communication: None present   Disposition Plan:  Patient is from: Home  Anticipated d/c is to: home  Anticipated d/c date is: 11/06/24 Patient currently: Pending improved respiratory status  Consults called: None  Admission status: Inpatient     Evalene GORMAN Sprinkles, MD [facility]  11/04/2024, 2:11 AM       [1] No Known Allergies  "
